# Patient Record
Sex: Male | Born: 2000 | Race: White | Hispanic: No | Marital: Single | Smoking: Never smoker
Health system: Southern US, Community
[De-identification: ages and names within clinical notes are randomized; demographics above are authoritative.]

## PROBLEM LIST (undated history)

## (undated) DIAGNOSIS — R569 Unspecified convulsions: Secondary | ICD-10-CM

## (undated) DIAGNOSIS — F909 Attention-deficit hyperactivity disorder, unspecified type: Secondary | ICD-10-CM

## (undated) DIAGNOSIS — Z9109 Other allergy status, other than to drugs and biological substances: Secondary | ICD-10-CM

## (undated) DIAGNOSIS — F419 Anxiety disorder, unspecified: Secondary | ICD-10-CM

## (undated) HISTORY — PX: APPENDECTOMY: SHX54

## (undated) HISTORY — DX: Attention-deficit hyperactivity disorder, unspecified type: F90.9

## (undated) HISTORY — PX: TYMPANOSTOMY TUBE PLACEMENT: SHX32

## (undated) HISTORY — DX: Unspecified convulsions: R56.9

---
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
## 2022-01-10 ENCOUNTER — Ambulatory Visit: Payer: BC Managed Care – PPO | Admitting: Orthopaedic Surgery

## 2022-01-15 ENCOUNTER — Ambulatory Visit (INDEPENDENT_AMBULATORY_CARE_PROVIDER_SITE_OTHER): Payer: BC Managed Care – PPO | Admitting: Orthopaedic Surgery

## 2022-01-15 ENCOUNTER — Ambulatory Visit (INDEPENDENT_AMBULATORY_CARE_PROVIDER_SITE_OTHER): Payer: BC Managed Care – PPO

## 2022-01-15 ENCOUNTER — Encounter: Payer: Self-pay | Admitting: Orthopaedic Surgery

## 2022-01-15 VITALS — BP 118/78 | HR 88 | Ht 70.0 in | Wt 173.0 lb

## 2022-01-15 DIAGNOSIS — M79644 Pain in right finger(s): Secondary | ICD-10-CM

## 2022-01-15 DIAGNOSIS — S62664A Nondisplaced fracture of distal phalanx of right ring finger, initial encounter for closed fracture: Secondary | ICD-10-CM

## 2022-01-15 NOTE — Progress Notes (Signed)
? ?Subjective:  ? ? Patient ID: Todd Lyons, male    DOB: 2000/11/07, 21 y.o.   MRN: [id number] ? ?HPI ?I hurt my finger playing softball about 10 days ago.   ? ?He was seen at Urgent Care in [place].  X-rays showed fracture of the right ring finger distally.  He did not bring X-rays with him.  He has no splint.  He has subungual hematoma and some swelling of the right ring finger.  He has no other injury.  He has little pain. ? ? ?Review of Systems  ?Constitutional:  Positive for activity change.  ?Musculoskeletal:  Positive for arthralgias.  ?Allergic/Immunologic: Positive for environmental allergies.  ?All other systems reviewed and are negative. ?For Review of Systems, all other systems reviewed and are negative. ? ?The following is a summary of the past history medically, past history surgically, known current medicines, social history and family history.  This information is gathered electronically by the computer from prior information and documentation.  I review this each visit and have found including this information at this point in the chart is beneficial and informative.  ? ?Past Medical History:  ?Diagnosis Date  ? ADHD (attention deficit hyperactivity disorder)   ? Environmental allergies   ? Seizures (HCC)   ? ? ?Past Surgical History:  ?Procedure Laterality Date  ? LAPAROSCOPIC APPENDECTOMY N/A 08/29/2021  ? Procedure: APPENDECTOMY LAPAROSCOPIC;  Surgeon: Lucretia RoersBridges, Lindsay C, MD;  Location: [facility];  Service: General;  Laterality: N/A;  ? TYMPANOSTOMY TUBE PLACEMENT    ? ? ?Current Outpatient Medications on File Prior to Visit  ?Medication Sig Dispense Refill  ? albuterol (PROVENTIL HFA;VENTOLIN HFA) 108 (90 Base) MCG/ACT inhaler Inhale 1-2 puffs into the lungs every 6 (six) hours as needed for wheezing or shortness of breath.     ? atomoxetine (STRATTERA) 80 MG capsule Take 100 mg by mouth at bedtime.    ? cetirizine (ZYRTEC) 10 MG tablet Take 10 mg by mouth daily.    ? LamoTRIgine 200 MG TB24 24  hour tablet Take 2 tablets by mouth at bedtime.    ? montelukast (SINGULAIR) 10 MG tablet Take 10 mg by mouth daily.    ? traZODone (DESYREL) 50 MG tablet Take 50 mg by mouth at bedtime.    ? benzonatate (TESSALON) 100 MG capsule Take 1 capsule (100 mg total) by mouth every 8 (eight) hours. (Patient not taking: Reported on 08/29/2021) 21 capsule 0  ? cetirizine-pseudoephedrine (ZYRTEC-D) 5-120 MG tablet Take 1 tablet by mouth daily. (Patient not taking: Reported on 08/29/2021) 30 tablet 0  ? cloNIDine (CATAPRES) 0.1 MG tablet TAKE 1 AND 1/2 TABLETS 30-45 MINS PRIOR TO BEDTIME (Patient not taking: Reported on 08/29/2021) 50 tablet 5  ? dicyclomine (BENTYL) 20 MG tablet Take 1 tablet (20 mg total) by mouth 2 (two) times daily. (Patient not taking: Reported on 08/29/2021) 20 tablet 0  ? escitalopram (LEXAPRO) 10 MG tablet Take 15 mg by mouth daily.    ? fluticasone (FLONASE) 50 MCG/ACT nasal spray Place 2 sprays into both nostrils daily. (Patient not taking: Reported on 08/29/2021) 16 g 0  ? lidocaine (XYLOCAINE) 2 % solution Use as directed 10 mLs in the mouth or throat every 3 (three) hours as needed for mouth pain. 100 mL 0  ? loperamide (IMODIUM) 2 MG capsule Take 1 capsule (2 mg total) by mouth 4 (four) times daily as needed for diarrhea or loose stools. (Patient not taking: Reported on 08/29/2021) 12 capsule 0  ?  montelukast (SINGULAIR) 5 MG chewable tablet Chew 1 tablet (5 mg total) by mouth daily. (Patient not taking: Reported on 08/29/2021) 30 tablet 5  ? NAYZILAM 5 MG/0.1ML SOLN     ? ondansetron (ZOFRAN) 4 MG tablet Take 1 tablet (4 mg total) by mouth every 8 (eight) hours as needed. 30 tablet 1  ? ondansetron (ZOFRAN-ODT) 4 MG disintegrating tablet Take 1 tablet (4 mg total) by mouth every 8 (eight) hours as needed for nausea or vomiting. (Patient not taking: Reported on 01/15/2022) 20 tablet 0  ? oxyCODONE (ROXICODONE) 5 MG immediate release tablet Take 1 tablet (5 mg total) by mouth every 4 (four) hours as  needed. 20 tablet 0  ? Pediatric Multiple Vit-C-FA (KIDS VITAMINS PO) Take 2 tablets by mouth daily. (Patient not taking: Reported on 01/15/2022)    ? [DISCONTINUED] levETIRAcetam 500 MG TB3D Take 1/2 tablet twice daily for 7 days, 1 tablet twice daily for 7 days, then 1-1/2 tablets twice daily 100 tablet 5  ? ?No current facility-administered medications on file prior to visit.  ? ? ?Social History  ? ?Socioeconomic History  ? Marital status: Single  ?  Spouse name: Not on file  ? Number of children: Not on file  ? Years of education: Not on file  ? Highest education level: Not on file  ?Occupational History  ? Not on file  ?Tobacco Use  ? Smoking status: Never  ? Smokeless tobacco: Never  ?Vaping Use  ? Vaping Use: Never used  ?Substance and Sexual Activity  ? Alcohol use: No  ? Drug use: No  ? Sexual activity: Yes  ?  Birth control/protection: Condom  ?Other Topics Concern  ? Not on file  ?Social History Narrative  ? 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  ? He attends [employer].  ? He lives with both parents. He has two brothers.  ? He enjoys firefighting, fishing, and video games.  ? ?Social Determinants of Health  ? ?Financial Resource Strain: Not on file  ?Food Insecurity: Not on file  ?Transportation Needs: Not on file  ?Physical Activity: Not on file  ?Stress: Not on file  ?Social Connections: Not on file  ?Intimate Partner Violence: Not on file  ? ? ?Family History  ?Problem Relation Age of Onset  ? Asthma Father   ? Asthma Paternal Grandmother   ? Allergic rhinitis Neg Hx   ? Angioedema Neg Hx   ? Atopy Neg Hx   ? Eczema Neg Hx   ? Immunodeficiency Neg Hx   ? Urticaria Neg Hx   ? ? ?BP 118/78   Pulse 88   Ht 5\' 10"  (1.778 m)   Wt 173 lb (78.5 kg)   BMI 24.82 kg/m?  ? ?Body mass index is 24.82 kg/m?. ? ?   ?Objective:  ? Physical Exam ?Vitals and nursing note reviewed. Exam conducted with a chaperone present.  ?Constitutional:   ?   Appearance: He is well-developed.  ?HENT:  ?   Head:  Normocephalic and atraumatic.  ?Eyes:  ?   Conjunctiva/sclera: Conjunctivae normal.  ?   Pupils: Pupils are equal, round, and reactive to light.  ?Cardiovascular:  ?   Rate and Rhythm: Normal rate and regular rhythm.  ?Pulmonary:  ?   Effort: Pulmonary effort is normal.  ?Abdominal:  ?   Palpations: Abdomen is soft.  ?Musculoskeletal:  ?     Hands: ? ?   Cervical back: Normal range of motion and neck supple.  ?Skin: ?  General: Skin is warm and dry.  ?Neurological:  ?   Mental Status: He is alert and oriented to person, place, and time.  ?   Cranial Nerves: No cranial nerve deficit.  ?   Motor: No abnormal muscle tone.  ?   Coordination: Coordination normal.  ?   Deep Tendon Reflexes: Reflexes are normal and symmetric. Reflexes normal.  ?Psychiatric:     ?   Behavior: Behavior normal.     ?   Thought Content: Thought content normal.     ?   Judgment: Judgment normal.  ?X-rays were done of the right ring finger, reported separately. ? ? ? ? ?   ?Assessment & Plan:  ? ?Encounter Diagnoses  ?Name Primary?  ? Pain in right finger(s) Yes  ? Closed nondisplaced fracture of distal phalanx of right ring finger, initial encounter   ? ?I have explained the findings to him. ? ?He has a splint from the Urgent Care.  He does not like wearing it.  I told him as long as he did not hit the finger or squeezed something hard, he should be OK. ? ?Return in one month. ? ?X-rays then. ? ?Call if any problem. ? ?Precautions discussed. ? ?Electronically Signed ?Darreld Mclean, MD ?5/2/20239:08 AM ? ?

## 2022-01-27 ENCOUNTER — Ambulatory Visit
Admission: EM | Admit: 2022-01-27 | Discharge: 2022-01-27 | Disposition: A | Discharge status: Home / Self Care | Payer: BC Managed Care – PPO | Attending: Internal Medicine | Admitting: Internal Medicine

## 2022-01-27 ENCOUNTER — Encounter: Payer: Self-pay | Admitting: Emergency Medicine

## 2022-01-27 DIAGNOSIS — K122 Cellulitis and abscess of mouth: Secondary | ICD-10-CM | POA: Insufficient documentation

## 2022-01-27 LAB — POCT RAPID STREP A (OFFICE): Rapid Strep A Screen: NEGATIVE

## 2022-01-27 MED ORDER — NYSTATIN 100000 UNIT/ML MT SUSP: 15.0000 mL | Freq: Three times a day (TID) | OROMUCOSAL | 0 refills | Status: DC

## 2022-01-27 NOTE — Discharge Instructions (Addendum)
Please take medications as prescribed ?Tylenol/Motrin as needed for pain and/or fever ?Strep test is negative ?We will call you with recommendations if labs are abnormal ?Please go to ED if you have persistent difficulty swallowing, noisy breathing or tightness in your throat. ?

## 2022-01-27 NOTE — ED Provider Notes (Signed)
?[facility] ? ? ? ?CSN: [id number] ?Arrival date & time: 01/27/22  [id number] ? ? ?  ? ?History   ?Chief Complaint ?No chief complaint on file. ? ? ?HPI ?Todd Lyons is a 21 y.o. male comes to the urgent care with discomfort in the throat this morning.  Patient woke up with the symptoms.  He looked in his throat and realized that his uvula was swollen.  He denies any changes in his medications..  No allergies to food.  No changes in dietary habits.  No pain with swallowing.  No sick contacts.  No shortness of breath, noisy breathing or chest tightness..  ? ?HPI ? ?Past Medical History:  ?Diagnosis Date  ? ADHD (attention deficit hyperactivity disorder)   ? Environmental allergies   ? Seizures ([place])   ? ? ?Patient Active Problem List  ? Diagnosis Date Noted  ? Acute appendicitis with localized peritonitis, without perforation, abscess, or gangrene   ? Insomnia 10/01/2019  ? 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 Poor sleep hygiene 08/23/2019  ? Single epileptic seizure ([place]) 12/09/2018  ? ADHD (attention deficit hyperactivity disorder)   ? CLOSED FRACTURE OF SHAFT OF FIBULA WITH TIBIA 01/11/2010  ? ? ?Past Surgical History:  ?Procedure Laterality Date  ? LAPAROSCOPIC APPENDECTOMY N/A 08/29/2021  ? Procedure: APPENDECTOMY LAPAROSCOPIC;  Surgeon: Virl Cagey, MD;  Location: [facility];  Service: General;  Laterality: N/A;  ? TYMPANOSTOMY TUBE PLACEMENT    ? ? ? ? ? ?Home Medications   ? ?Prior to Admission medications   ?Medication Sig Start Date End Date Taking? Authorizing Provider  ?magic mouthwash (nystatin, hydrocortisone, diphenhydrAMINE) suspension Swish and swallow 15 mLs 3 (three) times daily. Compounding formula: Nystatin 100,000 units/mL - 30 mL, hydrocortisone-60 mL, Benadryl 12.5 mg / 5 mL-QS to 272ml 01/27/22  Yes Jissell Trafton, Myrene Galas, MD  ?sertraline (ZOLOFT) 25 MG tablet Take 25 mg by mouth daily.   Yes [provider]  ?albuterol (PROVENTIL HFA;VENTOLIN HFA) 108 (90  Base) MCG/ACT inhaler Inhale 1-2 puffs into the lungs every 6 (six) hours as needed for wheezing or shortness of breath.  04/29/16   [provider]  ?atomoxetine (STRATTERA) 80 MG capsule Take 100 mg by mouth at bedtime. 11/04/18   [provider]  ?cetirizine (ZYRTEC) 10 MG tablet Take 10 mg by mouth daily.    [provider]  ?LamoTRIgine 200 MG TB24 24 hour tablet Take 2 tablets by mouth at bedtime. 07/03/21   [provider]  ?lidocaine (XYLOCAINE) 2 % solution Use as directed 10 mLs in the mouth or throat every 3 (three) hours as needed for mouth pain. 09/25/21   Volney American, PA-C  ?loperamide (IMODIUM) 2 MG capsule Take 1 capsule (2 mg total) by mouth 4 (four) times daily as needed for diarrhea or loose stools. ?Patient not taking: Reported on 08/29/2021 12/20/20   Wurst, [place], PA-C  ?montelukast (SINGULAIR) 10 MG tablet Take 10 mg by mouth daily. 09/16/19   [provider]  ?Jacqlyn Larsen 5 MG/0.1ML SOLN  11/12/19   [provider]  ?ondansetron (ZOFRAN) 4 MG tablet Take 1 tablet (4 mg total) by mouth every 8 (eight) hours as needed. 08/29/21 08/29/22  Virl Cagey, MD  ?oxyCODONE (ROXICODONE) 5 MG immediate release tablet Take 1 tablet (5 mg total) by mouth every 4 (four) hours as needed. 08/29/21 08/29/22  Virl Cagey, MD  ?traZODone (DESYREL) 50 MG tablet Take 50 mg by mouth at bedtime. 07/15/21  [provider]  ?levETIRAcetam 500 MG TB3D Take 1/2 tablet twice daily for 7 days, 1 tablet twice daily for 7 days, then 1-1/2 tablets twice daily 10/01/19 08/03/20  Jodi Geralds, MD  ? ? ?Family History ?Family History  ?Problem Relation Age of Onset  ? Asthma Father   ? Asthma Paternal Grandmother   ? Allergic rhinitis Neg Hx   ? Angioedema Neg Hx   ? Atopy Neg Hx   ? Eczema Neg Hx   ? Immunodeficiency Neg Hx   ? Urticaria Neg Hx   ? ? ?Social History ?Social History  ? ?Tobacco Use  ? Smoking status: Never  ? Smokeless  tobacco: Never  ?Vaping Use  ? Vaping Use: Never used  ?Substance Use Topics  ? Alcohol use: No  ? Drug use: No  ? ? ? ?Allergies   ?Patient has no known allergies. ? ? ?Review of Systems ?Review of Systems ?As per HPI ? ?Physical Exam ?Triage Vital Signs ?ED Triage Vitals  ?Enc Vitals Group  ?   BP 01/27/22 0929 124/77  ?   Pulse Rate 01/27/22 0929 (!) 106  ?   Resp 01/27/22 0929 18  ?   Temp 01/27/22 0929 98 ?F (36.7 ?C)  ?   Temp Source 01/27/22 0929 Oral  ?   SpO2 01/27/22 0929 99 %  ?   Weight --   ?   Height --   ?   Head Circumference --   ?   Peak Flow --   ?   Pain Score 01/27/22 0930 2  ?   Pain Loc --   ?   Pain Edu? --   ?   Excl. in [place]? --   ? ?No data found. ? ?Updated Vital Signs ?BP 124/77 (BP Location: Right Arm)   Pulse (!) 106   Temp 98 ?F (36.7 ?C) (Oral)   Resp 18   SpO2 99%  ? ?Visual Acuity ?Right Eye Distance:   ?Left Eye Distance:   ?Bilateral Distance:   ? ?Right Eye Near:   ?Left Eye Near:    ?Bilateral Near:    ? ?Physical Exam ?Vitals and nursing note reviewed.  ?HENT:  ?   Mouth/Throat:  ?   Mouth: Mucous membranes are moist.  ?   Pharynx: No oropharyngeal exudate or posterior oropharyngeal erythema.  ?   Comments: Uvula appears swollen.  Airway remains patent. ?Cardiovascular:  ?   Rate and Rhythm: Normal rate and regular rhythm.  ?   Pulses: Normal pulses.  ?   Heart sounds: Normal heart sounds.  ?Pulmonary:  ?   Effort: Pulmonary effort is normal.  ?   Breath sounds: Normal breath sounds.  ? ? ? ?UC Treatments / Results  ?Labs ?(all labs ordered are listed, but only abnormal results are displayed) ?Labs Reviewed  ?CULTURE, GROUP A STREP [facility])  ?POCT RAPID STREP A (OFFICE)  ? ? ?EKG ? ? ?Radiology ?No results found. ? ?Procedures ?Procedures (including critical care time) ? ?Medications Ordered in UC ?Medications - No data to display ? ?Initial Impression / Assessment and Plan / UC Course  ?I have reviewed the triage vital signs and the nursing notes. ? ?Pertinent labs & imaging  results that were available during my care of the patient were reviewed by me and considered in my medical decision making (see chart for details). ? ?  ? ?1.  Acute uvulitis: ?Mouthwash with hydrocortisone will help with swelling and inflammation ?Tylenol/Motrin as needed  for pain and/or fever ?Strep test is negative ?Return to ED precautions given ?Final Clinical Impressions(s) / UC Diagnoses  ? ?Final diagnoses:  ?Uvulitis  ? ? ? ?Discharge Instructions   ? ?  ?Please take medications as prescribed ?Tylenol/Motrin as needed for pain and/or fever ?Strep test is negative ?We will call you with recommendations if labs are abnormal ?Please go to ED if you have persistent difficulty swallowing, noisy breathing or tightness in your throat. ? ? ?ED Prescriptions   ? ? Medication Sig Dispense Auth. Provider  ? magic mouthwash (nystatin, hydrocortisone, diphenhydrAMINE) suspension Swish and swallow 15 mLs 3 (three) times daily. Compounding formula: Nystatin 100,000 units/mL - 30 mL, hydrocortisone-60 mL, Benadryl 12.5 mg / 5 mL-QS to 237ml 240 mL Kiyara Bouffard, Myrene Galas, MD  ? ?  ? ?PDMP not reviewed this encounter. ?  ?Chase Picket, MD ?01/27/22 1115 ? ?

## 2022-01-27 NOTE — ED Triage Notes (Signed)
Sore throat that started today. 

## 2022-01-30 LAB — CULTURE, GROUP A STREP (THRC)

## 2022-02-26 ENCOUNTER — Encounter: Payer: BC Managed Care – PPO | Admitting: Orthopaedic Surgery

## 2022-06-04 ENCOUNTER — Encounter: Payer: Self-pay | Admitting: Emergency Medicine

## 2022-06-04 ENCOUNTER — Ambulatory Visit
Admission: EM | Admit: 2022-06-04 | Discharge: 2022-06-04 | Disposition: A | Discharge status: Home / Self Care | Payer: BC Managed Care – PPO | Attending: Emergency Medicine | Admitting: Emergency Medicine

## 2022-06-04 DIAGNOSIS — K649 Unspecified hemorrhoids: Secondary | ICD-10-CM

## 2022-06-04 MED ORDER — GOODSENSE PSYLLIUM FIBER 51.7 % PO POWD: 1.0000 | Freq: Two times a day (BID) | ORAL | 1 refills | Status: DC

## 2022-06-04 MED ORDER — PHENYLEPHRINE-MINERAL OIL-PET 0.25-14-74.9 % RE OINT: 1.0000 | TOPICAL_OINTMENT | Freq: Two times a day (BID) | RECTAL | 2 refills | Status: DC | PRN

## 2022-06-04 NOTE — Discharge Instructions (Addendum)
At this time, based on the information that you provided to me, the believe that the most likely source of your bleeding is a simple hemorrhoid.  I have sent a prescription for medication to your pharmacy that I want you to use twice daily to constrict blood vessels and hopefully stop the bleeding.  Please continue taking a fiber capsule twice daily with 16 ounces of water each time and begin drinking another 32 to 48 ounces of water throughout the day.  I have enclosed some information about hemorrhoids and diverticulitis that I hope you find helpful.  If you continue to have bleeding when you move your bowels after 7 to 14 days of this regimen, please follow-up with your primary care provider to discuss referral to gastroenterology for further evaluation.    Thank you for visiting urgent care today.

## 2022-06-04 NOTE — ED Provider Notes (Signed)
[facility]    CSN: [id number] Arrival date & time: 06/04/22  1516    HISTORY   Chief Complaint  Patient presents with   Rectal Bleeding   HPI Todd Lyons is a pleasant, 21 y.o. male who presents to urgent care today. Patient complains of painless bleeding from his anus after moving his bowels, states he notices it both on his toilet tissue after wiping as well as small drops in the toilet.  Patient states this has been going on for a week.  Patient states he has had this in the past but is never lasted this long.  Patient states his grandmother has a history of diverticulitis, states he decided to purchase some over-the-counter psyllium fiber capsules which he has been taking for the past 2 days without relief.  Patient states he does not drink a lot of water, denies known diagnosis of constipation, abdominal pain, fever, body aches, chills, dizziness, feeling bloated, diarrhea, nausea, vomiting.  Vital signs are normal at this time.  Patient is otherwise well-appearing.  The history is provided by the patient.   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 Authorizing Provider  albuterol (PROVENTIL HFA;VENTOLIN HFA) 108 (90 Base) MCG/ACT inhaler Inhale 1-2 puffs into the lungs every 6 (six) hours as needed for wheezing or shortness of breath.  04/29/16   [provider]  atomoxetine (STRATTERA) 80 MG capsule Take 100 mg by mouth at bedtime. 11/04/18   [provider]  cetirizine (ZYRTEC) 10 MG tablet Take 10 mg by mouth daily.    [provider]  LamoTRIgine 200 MG TB24 24 hour tablet Take 2 tablets by mouth at bedtime. 07/03/21   [provider]  montelukast (SINGULAIR) 10 MG tablet Take 10 mg by mouth daily. 09/16/19   [provider]  NAYZILAM 5 MG/0.1ML SOLN  11/12/19   [provider]  sertraline (ZOLOFT) 25 MG tablet Take 25 mg by mouth daily.    [provider]  traZODone (DESYREL) 50 MG tablet Take 50 mg by mouth at bedtime. 07/15/21   [provider]  levETIRAcetam 500 MG TB3D Take 1/2 tablet twice daily for 7 days, 1 tablet twice daily for 7 days, then 1-1/2 tablets twice daily 10/01/19 08/03/20  Deetta Perla, MD    Family History Family History  Problem Relation Age of Onset   Asthma Father    Asthma Paternal Grandmother    Allergic rhinitis Neg Hx    Angioedema Neg Hx    Atopy Neg Hx    Eczema Neg Hx    Immunodeficiency Neg Hx  Urticaria Neg Hx    Social History Social History   Tobacco Use   Smoking status: Never   Smokeless tobacco: Never  Vaping Use   Vaping Use: Never used  Substance Use Topics   Alcohol use: No   Drug use: No   Allergies   Patient has no known allergies.  Review of Systems Review of Systems Pertinent findings revealed after performing a 14 point review of systems has been noted in the history of present illness.  Physical Exam Triage Vital Signs ED Triage Vitals  Enc Vitals Group     BP 07/13/21 0827 (!) 147/82     Pulse Rate 07/13/21 0827 72     Resp  07/13/21 0827 18     Temp 07/13/21 0827 98.3 F (36.8 C)     Temp Source 07/13/21 0827 Oral     SpO2 07/13/21 0827 98 %     Weight --      Height --      Head Circumference --      Peak Flow --      Pain Score 07/13/21 0826 5     Pain Loc --      Pain Edu? --      Excl. in GC? --   No data found.  Updated Vital Signs BP 107/72 (BP Location: Right Arm)   Pulse 89   Temp 98.2 F (36.8 C) (Oral)   Resp 14   SpO2 96%   Physical Exam Vitals and nursing note reviewed.  Constitutional:      General: He is not in acute distress.    Appearance: Normal appearance. He is normal weight. He is not ill-appearing.  HENT:     Head: Normocephalic and atraumatic.  Eyes:     Extraocular Movements: Extraocular movements intact.     Conjunctiva/sclera: Conjunctivae normal.     Pupils: Pupils are equal, round, and reactive to light.  Cardiovascular:     Rate and Rhythm: Normal rate and regular rhythm.  Pulmonary:     Effort: Pulmonary effort is normal.     Breath sounds: Normal breath sounds.  Genitourinary:    Comments: Patient politely declines rectal exam during his visit today. Musculoskeletal:        General: Normal range of motion.     Cervical back: Normal range of motion and neck supple.  Skin:    General: Skin is warm and dry.  Neurological:     General: No focal deficit present.     Mental Status: He is alert and oriented to person, place, and time. Mental status is at baseline.  Psychiatric:        Mood and Affect: Mood normal.        Behavior: Behavior normal.        Thought Content: Thought content normal.        Judgment: Judgment normal.     Visual Acuity Right Eye Distance:   Left Eye Distance:   Bilateral Distance:    Right Eye Near:   Left Eye Near:    Bilateral Near:     UC Couse / Diagnostics / Procedures:     Radiology No results found.  Procedures Procedures (including critical care time) EKG  Pending results:  Labs Reviewed - No data to  display   Medications Ordered in UC: Medications - No data to display  UC Diagnoses / Final Clinical Impressions(s)   I have reviewed the triage vital signs and the nursing notes.  Pertinent labs &  imaging results that were available during my care of the patient were reviewed by me and considered in my medical decision making (see chart for details).    Final diagnoses:  Hemorrhoids, unspecified hemorrhoid type   Conservative care provided for presumed hemorrhoids.  Patient advised that if bleeding or abdominal pain persists imaging will be indicated.  Patient advised he can go to the emergency room for acute pain or reach out to his primary care physician to have this ordered if needed.  Return precautions advised.  ED Prescriptions     Medication Sig Dispense Auth. Provider   phenylephrine-shark liver oil-mineral oil-petrolatum (PREPARATION H) 0.25-14-74.9 % rectal ointment Place 1 Application rectally 2 (two) times daily as needed for hemorrhoids. 56 g Theadora Rama Scales, PA-C   Psyllium (GOODSENSE PSYLLIUM FIBER) 51.7 % POWD Take 1 capsule by mouth every 12 (twelve) hours. 660 g Theadora Rama Scales, PA-C      PDMP not reviewed this encounter.  Disposition Upon Discharge:  Condition: stable for discharge home  Patient presented with concern for an acute illness with associated systemic symptoms and significant discomfort requiring urgent management. In my opinion, this is a condition that a prudent lay person (someone who possesses an average knowledge of health and medicine) may potentially expect to result in complications if not addressed urgently such as respiratory distress, impairment of bodily function or dysfunction of bodily organs.   As such, the patient has been evaluated and assessed, work-up was performed and treatment was provided in alignment with urgent care protocols and evidence based medicine.  Patient/parent/caregiver has been advised that the patient may  require follow up for further testing and/or treatment if the symptoms continue in spite of treatment, as clinically indicated and appropriate.  Routine symptom specific, illness specific and/or disease specific instructions were discussed with the patient and/or caregiver at length.  Prevention strategies for avoiding STD exposure were also discussed.  The patient will follow up with their current PCP if and as advised. If the patient does not currently have a PCP we will assist them in obtaining one.   The patient may need specialty follow up if the symptoms continue, in spite of conservative treatment and management, for further workup, evaluation, consultation and treatment as clinically indicated and appropriate.  Patient/parent/caregiver verbalized understanding and agreement of plan as discussed.  All questions were addressed during visit.  Please see discharge instructions below for further details of plan.  Discharge Instructions:   Discharge Instructions      At this time, based on the information that you provided to me, the believe that the most likely source of your bleeding is a simple hemorrhoid.  I have sent a prescription for medication to your pharmacy that I want you to use twice daily to constrict blood vessels and hopefully stop the bleeding.  Please continue taking a fiber capsule twice daily with 16 ounces of water each time and begin drinking another 32 to 48 ounces of water throughout the day.  I have enclosed some information about hemorrhoids and diverticulitis that I hope you find helpful.  If you continue to have bleeding when you move your bowels after 7 to 14 days of this regimen, please follow-up with your primary care provider to discuss referral to gastroenterology for further evaluation.    Thank you for visiting urgent care today.      This office note has been dictated using Teaching laboratory technician.  Unfortunately, this method of  dictation can sometimes lead  to typographical or grammatical errors.  I apologize for your inconvenience in advance if this occurs.  Please do not hesitate to reach out to me if clarification is needed.       Lynden Oxford Scales, PA-C 06/05/22 1131

## 2022-06-04 NOTE — ED Triage Notes (Addendum)
Pt reports for a week had blood in stools and wiping. Reports sometimes stools are formed and sometimes they are loose, but every time uses bathroom has blood. Reports that will have intermittent abd cramping. Denies urinary problems

## 2022-06-06 ENCOUNTER — Emergency Department (HOSPITAL_COMMUNITY)
Admission: EM | Admit: 2022-06-06 | Discharge: 2022-06-06 | Discharge status: Against Medical Advice | Payer: BC Managed Care – PPO | Attending: Emergency Medicine | Admitting: Emergency Medicine

## 2022-06-06 ENCOUNTER — Encounter (HOSPITAL_COMMUNITY): Payer: Self-pay | Admitting: Emergency Medicine

## 2022-06-06 ENCOUNTER — Other Ambulatory Visit: Payer: Self-pay

## 2022-06-06 DIAGNOSIS — K6289 Other specified diseases of anus and rectum: Secondary | ICD-10-CM | POA: Diagnosis present

## 2022-06-06 DIAGNOSIS — Z5321 Procedure and treatment not carried out due to patient leaving prior to being seen by health care provider: Secondary | ICD-10-CM | POA: Diagnosis not present

## 2022-06-06 LAB — COMPREHENSIVE METABOLIC PANEL
ALT: 36 U/L (ref 0–44)
AST: 22 U/L (ref 15–41)
Albumin: 4.5 g/dL (ref 3.5–5.0)
Alkaline Phosphatase: 100 U/L (ref 38–126)
Anion gap: 9 (ref 5–15)
BUN: 10 mg/dL (ref 6–20)
CO2: 25 mmol/L (ref 22–32)
Calcium: 9.6 mg/dL (ref 8.9–10.3)
Chloride: 105 mmol/L (ref 98–111)
Creatinine, Ser: 0.96 mg/dL (ref 0.61–1.24)
GFR, Estimated: >60 mL/min (ref >60)
Glucose, Bld: 88 mg/dL (ref 70–99)
Potassium: 3.9 mmol/L (ref 3.5–5.1)
Sodium: 139 mmol/L (ref 135–145)
Total Bilirubin: 0.4 mg/dL (ref 0.3–1.2)
Total Protein: 7.1 g/dL (ref 6.5–8.1)

## 2022-06-06 LAB — CBC WITH DIFFERENTIAL/PLATELET
Abs Immature Granulocytes: 0.03 10*3/uL (ref 0.00–0.07)
Basophils Absolute: 0 10*3/uL (ref 0.0–0.1)
Basophils Relative: 1 %
Eosinophils Absolute: 0.1 10*3/uL (ref 0.0–0.5)
Eosinophils Relative: 1 %
HCT: 45.2 % (ref 39.0–52.0)
Hemoglobin: 15.7 g/dL (ref 13.0–17.0)
Immature Granulocytes: 0 %
Lymphocytes Relative: 30 %
Lymphs Abs: 2.3 10*3/uL (ref 0.7–4.0)
MCH: 30.3 pg (ref 26.0–34.0)
MCHC: 34.7 g/dL (ref 30.0–36.0)
MCV: 87.1 fL (ref 80.0–100.0)
Monocytes Absolute: 1 10*3/uL (ref 0.1–1.0)
Monocytes Relative: 13 %
Neutro Abs: 4.2 10*3/uL (ref 1.7–7.7)
Neutrophils Relative %: 55 %
Platelets: 435 10*3/uL — ABNORMAL HIGH (ref 150–400)
RBC: 5.19 MIL/uL (ref 4.22–5.81)
RDW: 12.3 % (ref 11.5–15.5)
WBC: 7.6 10*3/uL (ref 4.0–10.5)
nRBC: 0 % (ref 0.0–0.2)

## 2022-06-06 LAB — TYPE AND SCREEN
ABO/RH(D): O POS
Antibody Screen: NEGATIVE

## 2022-06-06 LAB — ABO/RH: ABO/RH(D): O POS

## 2022-06-06 NOTE — ED Provider Triage Note (Signed)
Emergency Medicine Provider Triage Evaluation Note  Todd Lyons , a 21 y.o. male  was evaluated in triage.  Pt complains of rectal bleeding onset 1 week. Has rectal pain as well. No meds tried PTA. Denies history of hemorrhoids. Denies abdominal pain, nausea, vomiting, chest pain, shortness of breath, palpitations.  Review of Systems  Positive:  Negative:   Physical Exam  BP 119/72   Pulse 98   Resp 15   SpO2 97%  Gen:   Awake, no distress   Resp:  Normal effort  MSK:   Moves extremities without difficulty  Other:  Work-up initiated  Medical Decision Making  Medically screening exam initiated at 8:23 PM.  Appropriate orders placed.  Oma A Shelton was informed that the remainder of the evaluation will be completed by another provider, this initial triage assessment does not replace that evaluation, and the importance of remaining in the ED until their evaluation is complete.  Work-up initiated    Tyray Proch A, PA-C 06/06/22 2023

## 2022-06-06 NOTE — ED Triage Notes (Signed)
Pt reported to ED reporting copious amount of blood in stool that has been ongoing x1 week. Endorses pain to rectum. Denies hx of hemorrhoids.

## 2022-06-06 NOTE — ED Notes (Signed)
PT called X2 for vitals no response

## 2022-06-06 NOTE — ED Notes (Addendum)
Called for patient x 3, no response 

## 2022-08-12 ENCOUNTER — Emergency Department (HOSPITAL_BASED_OUTPATIENT_CLINIC_OR_DEPARTMENT_OTHER)
Admission: EM | Admit: 2022-08-12 | Discharge: 2022-08-12 | Disposition: A | Discharge status: Home / Self Care | Payer: BC Managed Care – PPO | Attending: Emergency Medicine | Admitting: Emergency Medicine

## 2022-08-12 ENCOUNTER — Other Ambulatory Visit: Payer: Self-pay

## 2022-08-12 ENCOUNTER — Encounter (HOSPITAL_BASED_OUTPATIENT_CLINIC_OR_DEPARTMENT_OTHER): Payer: Self-pay | Admitting: Emergency Medicine

## 2022-08-12 DIAGNOSIS — M791 Myalgia, unspecified site: Secondary | ICD-10-CM | POA: Insufficient documentation

## 2022-08-12 DIAGNOSIS — J029 Acute pharyngitis, unspecified: Secondary | ICD-10-CM | POA: Diagnosis present

## 2022-08-12 DIAGNOSIS — Z20822 Contact with and (suspected) exposure to covid-19: Secondary | ICD-10-CM | POA: Diagnosis not present

## 2022-08-12 LAB — GROUP A STREP BY PCR: Group A Strep by PCR: NOT DETECTED

## 2022-08-12 LAB — RESP PANEL BY RT-PCR (FLU A&B, COVID) ARPGX2
Influenza A by PCR: NEGATIVE
Influenza B by PCR: NEGATIVE
SARS Coronavirus 2 by RT PCR: NEGATIVE

## 2022-08-12 MED ORDER — ACETAMINOPHEN 325 MG PO TABS
650.0000 mg | ORAL_TABLET | Freq: Once | ORAL | Status: AC
  Administered 2022-08-12: 650 mg via ORAL
  Filled 2022-08-12: qty 2

## 2022-08-12 MED ORDER — PENICILLIN G BENZATHINE 1200000 UNIT/2ML IM SUSY
1.2000 10*6.[IU] | PREFILLED_SYRINGE | Freq: Once | INTRAMUSCULAR | Status: AC
  Administered 2022-08-12: 1.2 10*6.[IU] via INTRAMUSCULAR
  Filled 2022-08-12: qty 2

## 2022-08-12 NOTE — Discharge Instructions (Addendum)
You presented today with a sore throat.  Your swab is negative for strep throat however we gave you a penicillin shot as treatment due to your exposure.  Continue to use Tylenol or Motrin for any pain or fevers.  Return with any worsening symptoms, otherwise you may see your PCP as needed.  It was a pleasure to meet you and we hope you feel better!

## 2022-08-12 NOTE — ED Notes (Signed)
Pt notes he'll be waiting in his car

## 2022-08-12 NOTE — ED Triage Notes (Signed)
Pt arrives to ED with c/o sore throat, fever, headache x2 days.

## 2022-08-12 NOTE — ED Provider Notes (Signed)
[facility] EMERGENCY DEPT Provider Note   CSN: [id number] Arrival date & time: 08/12/22  1547     History  Chief Complaint  Patient presents with   Sore Throat    Todd Lyons is a 21 y.o. male presenting with a concern for sore throat.  Reports his girlfriend tested positive for strep throat 2 days ago.  Says that his pain is better today but he has had some bodyaches and felt febrile.   Sore Throat       Home Medications Prior to Admission medications   Medication Sig Start Date End Date Taking? Authorizing Provider  albuterol (PROVENTIL HFA;VENTOLIN HFA) 108 (90 Base) MCG/ACT inhaler Inhale 1-2 puffs into the lungs every 6 (six) hours as needed for wheezing or shortness of breath.  04/29/16   [provider]  atomoxetine (STRATTERA) 80 MG capsule Take 100 mg by mouth at bedtime. 11/04/18   [provider]  cetirizine (ZYRTEC) 10 MG tablet Take 10 mg by mouth daily.    [provider]  LamoTRIgine 200 MG TB24 24 hour tablet Take 2 tablets by mouth at bedtime. 07/03/21   [provider]  montelukast (SINGULAIR) 10 MG tablet Take 10 mg by mouth daily. 09/16/19   [provider]  NAYZILAM 5 MG/0.1ML SOLN  11/12/19   [provider]  phenylephrine-shark liver oil-mineral oil-petrolatum (PREPARATION H) 0.25-14-74.9 % rectal ointment Place 1 Application rectally 2 (two) times daily as needed for hemorrhoids. 06/04/22   Theadora Rama Scales, PA-C  Psyllium (GOODSENSE PSYLLIUM FIBER) 51.7 % POWD Take 1 capsule by mouth every 12 (twelve) hours. 06/04/22   Theadora Rama Scales, PA-C  sertraline (ZOLOFT) 25 MG tablet Take 25 mg by mouth daily.    [provider]  traZODone (DESYREL) 50 MG tablet Take 50 mg by mouth at bedtime. 07/15/21   [provider]  levETIRAcetam 500 MG TB3D Take 1/2 tablet twice daily for 7 days, 1 tablet twice daily for 7 days, then 1-1/2 tablets twice daily 10/01/19 08/03/20   Deetta Perla, MD      Allergies    Patient has no known allergies.    Review of Systems   Review of Systems  Physical Exam Updated Vital Signs BP 126/71   Pulse (!) 121   Temp 98.7 F (37.1 C)   Resp 20   Ht 5\' 11"  (1.803 m)   Wt 79.4 kg   SpO2 99%   BMI 24.41 kg/m  Physical Exam Vitals and nursing note reviewed.  Constitutional:      General: He is not in acute distress.    Appearance: Normal appearance. He is not ill-appearing.  HENT:     Head: Normocephalic and atraumatic.     Mouth/Throat:     Mouth: Mucous membranes are moist.     Pharynx: Oropharynx is clear.     Tonsils: No tonsillar exudate or tonsillar abscesses.  Eyes:     General: No scleral icterus.    Conjunctiva/sclera: Conjunctivae normal.  Cardiovascular:     Rate and Rhythm: Normal rate and regular rhythm.  Pulmonary:     Effort: Pulmonary effort is normal. No respiratory distress.     Breath sounds: No wheezing or rhonchi.  Skin:    Findings: No rash.  Neurological:     Mental Status: He is alert.  Psychiatric:        Mood and Affect: Mood normal.     ED Results / Procedures / Treatments   Labs (all  labs ordered are listed, but only abnormal results are displayed) Labs Reviewed  GROUP A STREP BY PCR  RESP PANEL BY RT-PCR (FLU A&B, COVID) [id number]    EKG None  Radiology No results found.  Procedures Procedures   Medications Ordered in ED Medications - No data to display  ED Course/ Medical Decision Making/ A&P                           Medical Decision Making  21 year old male presenting today with concern for strep throat.  His girlfriend currently has strep throat and is being treated with amoxicillin.  Strep, flu and COVID test all negative today.  Due to his sore throat and clinical symptoms of strep I will go ahead and treat him for strep throat being that he is in close proximity to someone who was tested positive.  Given a penicillin injection, tylenol and  discharged home.  Return precautions discussed, otherwise patient can follow-up with PCP as necessary.  Final Clinical Impression(s) / ED Diagnoses Final diagnoses:  Pharyngitis, unspecified etiology    Rx / DC Orders ED Discharge Orders     None      Results and diagnoses were explained to the patient. Return precautions discussed in full. Patient had no additional questions and expressed complete understanding.   This chart was dictated using voice recognition software.  Despite best efforts to proofread,  errors can occur which can change the documentation meaning.    Saddie Benders, PA-C 08/12/22 1821    Mardene Sayer, MD 08/13/22 0002

## 2022-11-07 ENCOUNTER — Emergency Department (HOSPITAL_COMMUNITY)
Admission: EM | Admit: 2022-11-07 | Discharge: 2022-11-07 | Disposition: A | Discharge status: Home / Self Care | Payer: BC Managed Care – PPO | Attending: Emergency Medicine | Admitting: Emergency Medicine

## 2022-11-07 ENCOUNTER — Encounter (HOSPITAL_COMMUNITY): Payer: Self-pay

## 2022-11-07 ENCOUNTER — Emergency Department (HOSPITAL_COMMUNITY): Payer: BC Managed Care – PPO

## 2022-11-07 ENCOUNTER — Other Ambulatory Visit: Payer: Self-pay

## 2022-11-07 DIAGNOSIS — N132 Hydronephrosis with renal and ureteral calculous obstruction: Secondary | ICD-10-CM | POA: Diagnosis not present

## 2022-11-07 DIAGNOSIS — N200 Calculus of kidney: Secondary | ICD-10-CM

## 2022-11-07 DIAGNOSIS — R109 Unspecified abdominal pain: Secondary | ICD-10-CM | POA: Diagnosis present

## 2022-11-07 LAB — BASIC METABOLIC PANEL
Anion gap: 9 (ref 5–15)
BUN: 10 mg/dL (ref 6–20)
CO2: 21 mmol/L — ABNORMAL LOW (ref 22–32)
Calcium: 9.3 mg/dL (ref 8.9–10.3)
Chloride: 105 mmol/L (ref 98–111)
Creatinine, Ser: 1.05 mg/dL (ref 0.61–1.24)
GFR, Estimated: >60 mL/min (ref >60)
Glucose, Bld: 103 mg/dL — ABNORMAL HIGH (ref 70–99)
Potassium: 3.3 mmol/L — ABNORMAL LOW (ref 3.5–5.1)
Sodium: 135 mmol/L (ref 135–145)

## 2022-11-07 LAB — URINALYSIS, ROUTINE W REFLEX MICROSCOPIC
Bilirubin Urine: NEGATIVE
Glucose, UA: NEGATIVE mg/dL
Ketones, ur: NEGATIVE mg/dL
Leukocytes,Ua: NEGATIVE
Nitrite: NEGATIVE
Protein, ur: NEGATIVE mg/dL
RBC / HPF: >50 RBC/hpf (ref 0–5)
Specific Gravity, Urine: 1.012 (ref 1.005–1.030)
pH: 6 (ref 5.0–8.0)

## 2022-11-07 LAB — CBC WITH DIFFERENTIAL/PLATELET
Abs Immature Granulocytes: 0.04 10*3/uL (ref 0.00–0.07)
Basophils Absolute: 0.1 10*3/uL (ref 0.0–0.1)
Basophils Relative: 1 %
Eosinophils Absolute: 0.1 10*3/uL (ref 0.0–0.5)
Eosinophils Relative: 1 %
HCT: 44.9 % (ref 39.0–52.0)
Hemoglobin: 15.6 g/dL (ref 13.0–17.0)
Immature Granulocytes: 0 %
Lymphocytes Relative: 18 %
Lymphs Abs: 1.9 10*3/uL (ref 0.7–4.0)
MCH: 29.4 pg (ref 26.0–34.0)
MCHC: 34.7 g/dL (ref 30.0–36.0)
MCV: 84.7 fL (ref 80.0–100.0)
Monocytes Absolute: 0.8 10*3/uL (ref 0.1–1.0)
Monocytes Relative: 8 %
Neutro Abs: 7.5 10*3/uL (ref 1.7–7.7)
Neutrophils Relative %: 72 %
Platelets: 492 10*3/uL — ABNORMAL HIGH (ref 150–400)
RBC: 5.3 MIL/uL (ref 4.22–5.81)
RDW: 12.3 % (ref 11.5–15.5)
WBC: 10.4 10*3/uL (ref 4.0–10.5)
nRBC: 0 % (ref 0.0–0.2)

## 2022-11-07 MED ORDER — OXYCODONE-ACETAMINOPHEN 5-325 MG PO TABS
1.0000 | ORAL_TABLET | Freq: Once | ORAL | Status: AC
  Administered 2022-11-07: 1 via ORAL
  Filled 2022-11-07: qty 1

## 2022-11-07 MED ORDER — SODIUM CHLORIDE 0.9 % IV BOLUS
1000.0000 mL | Freq: Once | INTRAVENOUS | Status: AC
  Administered 2022-11-07: 1000 mL via INTRAVENOUS

## 2022-11-07 MED ORDER — OXYCODONE-ACETAMINOPHEN 5-325 MG PO TABS: 1.0000 | ORAL_TABLET | Freq: Four times a day (QID) | ORAL | 0 refills | Status: DC | PRN

## 2022-11-07 MED ORDER — KETOROLAC TROMETHAMINE 30 MG/ML IJ SOLN
15.0000 mg | Freq: Once | INTRAMUSCULAR | Status: AC
  Administered 2022-11-07: 15 mg via INTRAVENOUS
  Filled 2022-11-07: qty 1

## 2022-11-07 MED ORDER — NAPROXEN 500 MG PO TABS: 500.0000 mg | ORAL_TABLET | Freq: Two times a day (BID) | ORAL | 0 refills | Status: DC

## 2022-11-07 MED ORDER — HYDROMORPHONE HCL 1 MG/ML IJ SOLN
1.0000 mg | Freq: Once | INTRAMUSCULAR | Status: AC
  Administered 2022-11-07: 1 mg via INTRAVENOUS
  Filled 2022-11-07: qty 1

## 2022-11-07 MED ORDER — TAMSULOSIN HCL 0.4 MG PO CAPS: 0.4000 mg | ORAL_CAPSULE | Freq: Every day | ORAL | 0 refills | Status: DC

## 2022-11-07 MED ORDER — TAMSULOSIN HCL 0.4 MG PO CAPS
0.4000 mg | ORAL_CAPSULE | Freq: Once | ORAL | Status: AC
  Administered 2022-11-07: 0.4 mg via ORAL
  Filled 2022-11-07: qty 1

## 2022-11-07 MED ORDER — ONDANSETRON HCL 4 MG/2ML IJ SOLN
4.0000 mg | Freq: Once | INTRAMUSCULAR | Status: AC
  Administered 2022-11-07: 4 mg via INTRAVENOUS
  Filled 2022-11-07: qty 2

## 2022-11-07 NOTE — Discharge Instructions (Signed)
Make sure you are drinking plenty of fluids, strain your urine as discussed so you will know when this stone passes.  I am prescribing you a prescription strength anti-inflammatory pain reliever.  I recommend taking extra strength Tylenol in addition to this prescription, the 2 medications together can significantly reduce your pain symptoms without exposing you to narcotics.  Return here if you develop uncontrolled pain or if you develop uncontrolled vomiting or fever as the symptoms could suggest complications from this kidney stone.

## 2022-11-07 NOTE — ED Triage Notes (Signed)
Reports sudden onset of left flank pain 1 hour PTA. Denies any urinary symptoms, pain is constant and does not radiate.

## 2022-11-07 NOTE — ED Provider Notes (Signed)
[facility] Provider Note   CSN: [id number] Arrival date & time: 11/07/22  1308     History  Chief Complaint  Patient presents with   Flank Pain    Todd Lyons is a 22 y.o. male with a history including ADHD and epilepsy presenting for evaluation of sudden onset of left flank pain which started an hour prior to arrival.  He notes having a darker than normal urine yesterday evening which seems better today.  He denies increased urinary frequency, fevers or chills, he does have some nausea denies vomiting, worse with pain escalation.  He has had no treatment prior to arrival.  He does not have a history of kidney stones, but they do run in his family.  The history is provided by the patient.       Home Medications Prior to Admission medications   Medication Sig Start Date End Date Taking? Authorizing Provider  naproxen (NAPROSYN) 500 MG tablet Take 1 tablet (500 mg total) by mouth 2 (two) times daily. 11/07/22  Yes Danyel Griess, Almyra Free, PA-C  oxyCODONE-acetaminophen (PERCOCET/ROXICET) 5-325 MG tablet Take 1 tablet by mouth every 6 (six) hours as needed for severe pain. 11/07/22  Yes Laticia Vannostrand, Almyra Free, PA-C  oxyCODONE-acetaminophen (PERCOCET/ROXICET) 5-325 MG tablet Take 1 tablet by mouth every 6 (six) hours as needed for severe pain. 11/07/22  Yes Tyona Nilsen, Almyra Free, PA-C  tamsulosin (FLOMAX) 0.4 MG CAPS capsule Take 1 capsule (0.4 mg total) by mouth daily after supper. 11/07/22  Yes Rolan Wrightsman, Almyra Free, PA-C  albuterol (PROVENTIL HFA;VENTOLIN HFA) 108 (90 Base) MCG/ACT inhaler Inhale 1-2 puffs into the lungs every 6 (six) hours as needed for wheezing or shortness of breath.  04/29/16   [provider]  atomoxetine (STRATTERA) 80 MG capsule Take 100 mg by mouth at bedtime. 11/04/18   [provider]  cetirizine (ZYRTEC) 10 MG tablet Take 10 mg by mouth daily.    [provider]  LamoTRIgine 200 MG TB24 24 hour tablet Take 2 tablets by mouth at  bedtime. 07/03/21   [provider]  montelukast (SINGULAIR) 10 MG tablet Take 10 mg by mouth daily. 09/16/19   [provider]  NAYZILAM 5 MG/0.1ML SOLN  11/12/19   [provider]  phenylephrine-shark liver oil-mineral oil-petrolatum (PREPARATION H) 0.25-14-74.9 % rectal ointment Place 1 Application rectally 2 (two) times daily as needed for hemorrhoids. 06/04/22   Lynden Oxford Scales, PA-C  Psyllium (GOODSENSE PSYLLIUM FIBER) 51.7 % POWD Take 1 capsule by mouth every 12 (twelve) hours. 06/04/22   Lynden Oxford Scales, PA-C  sertraline (ZOLOFT) 25 MG tablet Take 25 mg by mouth daily.    [provider]  traZODone (DESYREL) 50 MG tablet Take 50 mg by mouth at bedtime. 07/15/21   [provider]  levETIRAcetam 500 MG TB3D Take 1/2 tablet twice daily for 7 days, 1 tablet twice daily for 7 days, then 1-1/2 tablets twice daily 10/01/19 08/03/20  Jodi Geralds, MD      Allergies    Morphine    Review of Systems   Review of Systems  Constitutional:  Negative for fever.  HENT:  Negative for congestion and sore throat.   Eyes: Negative.   Respiratory:  Negative for chest tightness and shortness of breath.   Cardiovascular:  Negative for chest pain.  Gastrointestinal:  Positive for nausea. Negative for abdominal pain.  Genitourinary:  Positive for flank pain. Negative for frequency and urgency.  Musculoskeletal:  Negative for arthralgias, joint  swelling and neck pain.  Skin: Negative.  Negative for rash and wound.  Neurological:  Negative for dizziness, weakness, light-headedness, numbness and headaches.  Psychiatric/Behavioral: Negative.    All other systems reviewed and are negative.   Physical Exam Updated Vital Signs BP 114/71   Pulse 84   Temp 98 F (36.7 C) (Oral)   Resp 15   Ht '5\' 10"'$  (1.778 m)   Wt 79.4 kg   SpO2 99%   BMI 25.11 kg/m  Physical Exam Vitals and nursing note reviewed.  Constitutional:      Appearance: He is  well-developed.  HENT:     Head: Normocephalic and atraumatic.  Eyes:     Conjunctiva/sclera: Conjunctivae normal.  Cardiovascular:     Rate and Rhythm: Normal rate and regular rhythm.     Heart sounds: Normal heart sounds.  Pulmonary:     Effort: Pulmonary effort is normal.     Breath sounds: Normal breath sounds. No wheezing.  Abdominal:     General: Bowel sounds are normal.     Palpations: Abdomen is soft.     Tenderness: There is no abdominal tenderness. There is left CVA tenderness.  Musculoskeletal:        General: Normal range of motion.     Cervical back: Normal range of motion.  Skin:    General: Skin is warm and dry.  Neurological:     Mental Status: He is alert.     ED Results / Procedures / Treatments   Labs (all labs ordered are listed, but only abnormal results are displayed) Labs Reviewed  BASIC METABOLIC PANEL - Abnormal; Notable for the following components:      Result Value   Potassium 3.3 (*)    CO2 21 (*)    Glucose, Bld 103 (*)    All other components within normal limits  CBC WITH DIFFERENTIAL/PLATELET - Abnormal; Notable for the following components:   Platelets 492 (*)    All other components within normal limits  URINALYSIS, ROUTINE W REFLEX MICROSCOPIC - Abnormal; Notable for the following components:   APPearance HAZY (*)    Hgb urine dipstick LARGE (*)    Bacteria, UA RARE (*)    All other components within normal limits    EKG None  Radiology No results found.  Procedures Procedures    Medications Ordered in ED Medications  HYDROmorphone (DILAUDID) injection 1 mg (1 mg Intravenous Given 11/07/22 1412)  ondansetron (ZOFRAN) injection 4 mg (4 mg Intravenous Given 11/07/22 1412)  ketorolac (TORADOL) 30 MG/ML injection 15 mg (15 mg Intravenous Given 11/07/22 1516)  sodium chloride 0.9 % bolus 1,000 mL (0 mLs Intravenous Stopped 11/07/22 1711)  tamsulosin (FLOMAX) capsule 0.4 mg (0.4 mg Oral Given 11/07/22 1812)   oxyCODONE-acetaminophen (PERCOCET/ROXICET) 5-325 MG per tablet 1 tablet (1 tablet Oral Given 11/07/22 1937)    ED Course/ Medical Decision Making/ A&P                             Medical Decision Making Sudden onset left flank pain suggesting kidney stone, also could suggest pyelonephritis, musculoskeletal source.  CT scan confirms 2 mm left ureteral stone.  He was given a urine strainer to strain all of his urine, referral to urology for follow-up care.  Pain medications and Flomax prescribed.  Strict return precautions were outlined.  Amount and/or Complexity of Data Reviewed Labs: ordered.    Details: Urine is significant for large amount of  hemoglobin.  His kidney function is normal range with a creatinine of 1.05.  WBC count of 10.4, hemoglobin of 15.6 Radiology: ordered.    Details: 2 mm calculus in the proximal left ureter.  Mild hydro.  Risk Prescription drug management.           Final Clinical Impression(s) / ED Diagnoses Final diagnoses:  Kidney stone    Rx / DC Orders ED Discharge Orders          Ordered    naproxen (NAPROSYN) 500 MG tablet  2 times daily        11/07/22 1805    tamsulosin (FLOMAX) 0.4 MG CAPS capsule  Daily after supper        11/07/22 1805    oxyCODONE-acetaminophen (PERCOCET/ROXICET) 5-325 MG tablet  Every 6 hours PRN        11/07/22 1908    oxyCODONE-acetaminophen (PERCOCET/ROXICET) 5-325 MG tablet  Every 6 hours PRN        11/07/22 1908              Landis Martins 11/10/22 2107    Milton Ferguson, MD 11/12/22 1535

## 2022-11-07 NOTE — ED Provider Triage Note (Signed)
Emergency Medicine Provider Triage Evaluation Note  Todd Lyons , a 22 y.o. male  was evaluated in triage.  Pt complains of left flank pain which started today.  He noted having dark urine yesterday, then today developed flank pain which started out suddenly but has escalated since it began.  He endorses nausea without emesis.  Denies fevers.  No kidney stone history..  Review of Systems  Positive: Flank pain, nausea, dark urine, no dysuria Negative: Fever, vomiting, abdominal pain  Physical Exam  BP (!) 135/93 (BP Location: Right Arm)   Pulse 100   Temp 98.1 F (36.7 C) (Oral)   Resp 18   Ht '5\' 10"'$  (1.778 m)   Wt 79.4 kg   SpO2 100%   BMI 25.11 kg/m  Gen:   Awake, no distress   Resp:  Normal effort  MSK:   Moves extremities without difficulty  Other:    Medical Decision Making  Medically screening exam initiated at 1:31 PM.  Appropriate orders placed.  Todd Lyons was informed that the remainder of the evaluation will be completed by another provider, this initial triage assessment does not replace that evaluation, and the importance of remaining in the ED until their evaluation is complete.     Evalee Jefferson, PA-C 11/07/22 1332

## 2022-11-08 MED FILL — Oxycodone w/ Acetaminophen Tab 5-325 MG: ORAL | Qty: 6 | Status: AC

## 2022-11-13 ENCOUNTER — Ambulatory Visit (INDEPENDENT_AMBULATORY_CARE_PROVIDER_SITE_OTHER): Payer: BC Managed Care – PPO | Admitting: Urology

## 2022-11-13 ENCOUNTER — Encounter: Payer: Self-pay | Admitting: Urology

## 2022-11-13 ENCOUNTER — Ambulatory Visit (HOSPITAL_COMMUNITY)
Admission: RE | Admit: 2022-11-13 | Discharge: 2022-11-13 | Disposition: A | Discharge status: Home / Self Care | Payer: BC Managed Care – PPO | Source: Ambulatory Visit | Attending: Urology | Admitting: Urology

## 2022-11-13 VITALS — BP 136/81 | HR 105

## 2022-11-13 DIAGNOSIS — N200 Calculus of kidney: Secondary | ICD-10-CM | POA: Diagnosis present

## 2022-11-13 MED ORDER — OXYCODONE-ACETAMINOPHEN 5-325 MG PO TABS: 1.0000 | ORAL_TABLET | Freq: Four times a day (QID) | ORAL | 0 refills | Status: DC | PRN

## 2022-11-13 MED ORDER — TAMSULOSIN HCL 0.4 MG PO CAPS: 0.4000 mg | ORAL_CAPSULE | Freq: Every day | ORAL | 0 refills | Status: DC

## 2022-11-13 NOTE — Patient Instructions (Signed)

## 2022-11-13 NOTE — Progress Notes (Signed)
11/13/2022 8:45 AM   Hetty Blend A Hipwell Mar 11, 2001 [id number]  Referring provider: Heywood Bene, PA-C [id number] [place],  [place] [address]  Left flank pain   HPI: Mr Tovar is a 22yo here for evaluation of nephrolithiasis. 1 week ago he developed sharp severe left flank pain and presented to the ER. He underwent CT and was diagnosed with a 37m left UPJ calculus an a 138mleft lower pole calculus. This is his first stone event. His mother has had 2 stones events int he past. Currently the pain is manageable wit percocet. He was placed on flomax in the ER. He has not passed his calculus. He denies any nausea or vomiting. KUB shows stable left proximal ureteral calculus   PMH: Past Medical History:  Diagnosis Date   ADHD (attention deficit hyperactivity disorder)    Environmental allergies    Seizures ([facility] [place]    Surgical History: Past Surgical History:  Procedure Laterality Date   APPENDECTOMY     LAPAROSCOPIC APPENDECTOMY N/A 08/29/2021   Procedure: APPENDECTOMY LAPAROSCOPIC;  Surgeon: BrVirl CageyMD;  Location: [facility];  Service: General;  Laterality: N/A;   TYMPANOSTOMY TUBE PLACEMENT      Home Medications:  Allergies as of 11/13/2022       Reactions   Morphine Rash        Medication List        Accurate as of November 13, 2022  8:45 AM. If you have any questions, ask your nurse or doctor.          albuterol 108 (90 Base) MCG/ACT inhaler Commonly known as: VENTOLIN HFA Inhale 1-2 puffs into the lungs every 6 (six) hours as needed for wheezing or shortness of breath.   atomoxetine 80 MG capsule Commonly known as: STRATTERA Take 100 mg by mouth at bedtime.   cetirizine 10 MG tablet Commonly known as: ZYRTEC Take 10 mg by mouth daily.   GoodSense Psyllium Fiber 51.7 % Powd Generic drug: Psyllium Take 1 capsule by mouth every 12 (twelve) hours.   LamoTRIgine 200 MG Tb24 24 hour tablet Take 2 tablets by mouth at bedtime.    montelukast 10 MG tablet Commonly known as: SINGULAIR Take 10 mg by mouth daily.   naproxen 500 MG tablet Commonly known as: NAPROSYN Take 1 tablet (500 mg total) by mouth 2 (two) times daily.   Nayzilam 5 MG/0.1ML Soln Generic drug: Midazolam   oxyCODONE-acetaminophen 5-325 MG tablet Commonly known as: PERCOCET/ROXICET Take 1 tablet by mouth every 6 (six) hours as needed for severe pain.   oxyCODONE-acetaminophen 5-325 MG tablet Commonly known as: PERCOCET/ROXICET Take 1 tablet by mouth every 6 (six) hours as needed for severe pain.   phenylephrine-shark liver oil-mineral oil-petrolatum 0.25-14-74.9 % rectal ointment Commonly known as: PREPARATION H Place 1 Application rectally 2 (two) times daily as needed for hemorrhoids.   sertraline 25 MG tablet Commonly known as: ZOLOFT Take 25 mg by mouth daily.   tamsulosin 0.4 MG Caps capsule Commonly known as: FLOMAX Take 1 capsule (0.4 mg total) by mouth daily after supper.   traZODone 50 MG tablet Commonly known as: DESYREL Take 50 mg by mouth at bedtime.        Allergies:  Allergies  Allergen Reactions   Morphine Rash    Family History: Family History  Problem Relation Age of Onset   Asthma Father    Asthma Paternal Grandmother    Allergic rhinitis Neg Hx    Angioedema Neg Hx  Atopy Neg Hx    Eczema Neg Hx    Immunodeficiency Neg Hx    Urticaria Neg Hx     Social History:  reports that he has never smoked. He has never used smokeless tobacco. He reports that he does not drink alcohol and does not use drugs.  ROS: All other review of systems were reviewed and are negative except what is noted above in HPI  Physical Exam: There were no vitals taken for this visit.  Constitutional:  Alert and oriented, No acute distress. HEENT: [place] AT, moist mucus membranes.  Trachea midline, no masses. Cardiovascular: No clubbing, cyanosis, or edema. Respiratory: Normal respiratory effort, no increased work of  breathing. GI: Abdomen is soft, nontender, nondistended, no abdominal masses GU: No CVA tenderness.  Lymph: No cervical or inguinal lymphadenopathy. Skin: No rashes, bruises or suspicious lesions. Neurologic: Grossly intact, no focal deficits, moving all 4 extremities. Psychiatric: Normal mood and affect.  Laboratory Data: Lab Results  Component Value Date   WBC 10.4 11/07/2022   HGB 15.6 11/07/2022   HCT 44.9 11/07/2022   MCV 84.7 11/07/2022   PLT 492 (H) 11/07/2022    Lab Results  Component Value Date   CREATININE 1.05 11/07/2022    No results found for: "PSA"  No results found for: "TESTOSTERONE"  No results found for: "HGBA1C"  Urinalysis    Component Value Date/Time   COLORURINE YELLOW 11/07/2022 1712   APPEARANCEUR HAZY (A) 11/07/2022 1712   LABSPEC 1.012 11/07/2022 1712   PHURINE 6.0 11/07/2022 1712   GLUCOSEU NEGATIVE 11/07/2022 1712   HGBUR LARGE (A) 11/07/2022 1712   BILIRUBINUR NEGATIVE 11/07/2022 1712   KETONESUR NEGATIVE 11/07/2022 1712   PROTEINUR NEGATIVE 11/07/2022 1712   NITRITE NEGATIVE 11/07/2022 1712   LEUKOCYTESUR NEGATIVE 11/07/2022 1712    Lab Results  Component Value Date   BACTERIA RARE (A) 11/07/2022    Pertinent Imaging: Ct 11/07/2022 and KUB today: Images reviewed and discussed with the patient  No results found for this or any previous visit.  No results found for this or any previous visit.  No results found for this or any previous visit.  No results found for this or any previous visit.  No results found for this or any previous visit.  No valid procedures specified. No results found for this or any previous visit.  Results for orders placed during the hospital encounter of 11/07/22  CT Renal Stone Study  Narrative CLINICAL DATA:  Sudden onset left flank pain.  EXAM: CT ABDOMEN AND PELVIS WITHOUT CONTRAST  TECHNIQUE: Multidetector CT imaging of the abdomen and pelvis was performed following the standard  protocol without IV contrast.  RADIATION DOSE REDUCTION: This exam was performed according to the departmental dose-optimization program which includes automated exposure control, adjustment of the mA and/or kV according to patient size and/or use of iterative reconstruction technique.  COMPARISON:  CT abdomen pelvis dated August 29, 2021.  FINDINGS: Lower chest: No acute abnormality.  Hepatobiliary: No focal liver abnormality is seen. No gallstones, gallbladder wall thickening, or biliary dilatation.  Pancreas: Unremarkable. No pancreatic ductal dilatation or surrounding inflammatory changes.  Spleen: Normal in size without focal abnormality.  Adrenals/Urinary Tract: The adrenal glands and right kidney are unremarkable. New 2 mm calculus in the proximal left ureter just beyond the left UPJ, with mild fullness of the left renal collecting system. The bladder is mostly decompressed.  Stomach/Bowel: Stomach is within normal limits. Prior appendectomy. No evidence of bowel wall thickening, distention, or inflammatory  changes.  Vascular/Lymphatic: No significant vascular findings are present. No enlarged abdominal or pelvic lymph nodes.  Reproductive: Prostate is unremarkable.  Other: No abdominal wall hernia or abnormality. No abdominopelvic ascites. No pneumoperitoneum.  Musculoskeletal: No acute or significant osseous findings.  IMPRESSION: 1. New 2 mm calculus in the proximal left ureter just beyond the UPJ. Mild fullness of the left renal collecting system without frank hydronephrosis.   Electronically Signed By: Titus Dubin M.D. On: 11/07/2022 14:12   Assessment & Plan:    1. Kidney stones -We discussed the management of kidney stones. These options include observation, ureteroscopy, shockwave lithotripsy (ESWL) and percutaneous nephrolithotomy (PCNL). We discussed which options are relevant to the patient's stone(s). We discussed the natural history of  kidney stones as well as the complications of untreated stones and the impact on quality of life without treatment as well as with each of the above listed treatments. We also discussed the efficacy of each treatment in its ability to clear the stone burden. With any of these management options I discussed the signs and symptoms of infection and the need for emergent treatment should these be experienced. For each option we discussed the ability of each procedure to clear the patient of their stone burden.   For observation I described the risks which include but are not limited to silent renal damage, life-threatening infection, need for emergent surgery, failure to pass stone and pain.   For ureteroscopy I described the risks which include bleeding, infection, damage to contiguous structures, positioning injury, ureteral stricture, ureteral avulsion, ureteral injury, need for prolonged ureteral stent, inability to perform ureteroscopy, need for an interval procedure, inability to clear stone burden, stent discomfort/pain, heart attack, stroke, pulmonary embolus and the inherent risks with general anesthesia.   For shockwave lithotripsy I described the risks which include arrhythmia, kidney contusion, kidney hemorrhage, need for transfusion, pain, inability to adequately break up stone, inability to pass stone fragments, Steinstrasse, infection associated with obstructing stones, need for alternate surgical procedure, need for repeat shockwave lithotripsy, MI, CVA, PE and the inherent risks with anesthesia/conscious sedation.   For PCNL I described the risks including positioning injury, pneumothorax, hydrothorax, need for chest tube, inability to clear stone burden, renal laceration, arterial venous fistula or malformation, need for embolization of kidney, loss of kidney or renal function, need for repeat procedure, need for prolonged nephrostomy tube, ureteral avulsion, MI, CVA, PE and the inherent risks  of general anesthesia.   - The patient would like to proceed with medical expulsive therapy. Followup 1 week with KUB - Urinalysis, Routine w reflex microscopic   No follow-ups on file.  Nicolette Bang, MD  [facility] [place]

## 2022-11-14 ENCOUNTER — Encounter: Payer: Self-pay | Admitting: Radiology

## 2022-11-14 LAB — URINALYSIS, ROUTINE W REFLEX MICROSCOPIC
Bilirubin, UA: NEGATIVE
Glucose, UA: NEGATIVE
Ketones, UA: NEGATIVE
Nitrite, UA: NEGATIVE
Protein,UA: NEGATIVE
Specific Gravity, UA: 1.02 (ref 1.005–1.030)
Urobilinogen, Ur: 0.2 mg/dL (ref 0.2–1.0)
pH, UA: 7 (ref 5.0–7.5)

## 2022-11-14 LAB — MICROSCOPIC EXAMINATION: Bacteria, UA: NONE SEEN

## 2022-11-20 ENCOUNTER — Ambulatory Visit (INDEPENDENT_AMBULATORY_CARE_PROVIDER_SITE_OTHER): Payer: BC Managed Care – PPO | Admitting: Urology

## 2022-11-20 ENCOUNTER — Ambulatory Visit (HOSPITAL_COMMUNITY)
Admission: RE | Admit: 2022-11-20 | Discharge: 2022-11-20 | Disposition: A | Discharge status: Home / Self Care | Payer: BC Managed Care – PPO | Source: Ambulatory Visit | Attending: Urology | Admitting: Urology

## 2022-11-20 VITALS — BP 131/82 | HR 116

## 2022-11-20 DIAGNOSIS — N200 Calculus of kidney: Secondary | ICD-10-CM | POA: Insufficient documentation

## 2022-11-20 LAB — URINALYSIS, ROUTINE W REFLEX MICROSCOPIC
Bilirubin, UA: NEGATIVE
Glucose, UA: NEGATIVE
Ketones, UA: NEGATIVE
Leukocytes,UA: NEGATIVE
Nitrite, UA: NEGATIVE
Protein,UA: NEGATIVE
Specific Gravity, UA: 1.03 (ref 1.005–1.030)
Urobilinogen, Ur: 0.2 mg/dL (ref 0.2–1.0)
pH, UA: 5.5 (ref 5.0–7.5)

## 2022-11-20 LAB — MICROSCOPIC EXAMINATION: Bacteria, UA: NONE SEEN

## 2022-11-20 NOTE — Progress Notes (Signed)
11/20/2022 10:10 AM   Todd Lyons 2001-02-18 [id number]  Referring provider: Heywood Bene, PA-C [address] [place],  [place] [address]  Followup nephrolithiasis   HPI: Todd Lyons is a 22yo here for followup for nephrolithiasis. He has not passed has calculus. He denies any flank pain currently. No significant LUTS. No hematuria or dysuria. No other complaints today   PMH: Past Medical History:  Diagnosis Date   ADHD (attention deficit hyperactivity disorder)    Environmental allergies    Seizures ([place])     Surgical History: Past Surgical History:  Procedure Laterality Date   APPENDECTOMY     LAPAROSCOPIC APPENDECTOMY N/A 08/29/2021   Procedure: APPENDECTOMY LAPAROSCOPIC;  Surgeon: Virl Cagey, MD;  Location: [facility];  Service: General;  Laterality: N/A;   TYMPANOSTOMY TUBE PLACEMENT      Home Medications:  Allergies as of 11/20/2022       Reactions   Morphine Rash        Medication List        Accurate as of November 20, 2022 10:10 AM. If you have any questions, ask your nurse or doctor.          albuterol 108 (90 Base) MCG/ACT inhaler Commonly known as: VENTOLIN HFA Inhale 1-2 puffs into the lungs every 6 (six) hours as needed for wheezing or shortness of breath.   atomoxetine 80 MG capsule Commonly known as: STRATTERA Take 100 mg by mouth at bedtime.   cetirizine 10 MG tablet Commonly known as: ZYRTEC Take 10 mg by mouth daily.   GoodSense Psyllium Fiber 51.7 % Powd Generic drug: Psyllium Take 1 capsule by mouth every 12 (twelve) hours.   LamoTRIgine 200 MG Tb24 24 hour tablet Take 2 tablets by mouth at bedtime.   montelukast 10 MG tablet Commonly known as: SINGULAIR Take 10 mg by mouth daily.   naproxen 500 MG tablet Commonly known as: NAPROSYN Take 1 tablet (500 mg total) by mouth 2 (two) times daily.   Nayzilam 5 MG/0.1ML Soln Generic drug: Midazolam   oxyCODONE-acetaminophen 5-325 MG tablet Commonly known  as: PERCOCET/ROXICET Take 1 tablet by mouth every 6 (six) hours as needed for severe pain.   phenylephrine-shark liver oil-mineral oil-petrolatum 0.25-14-74.9 % rectal ointment Commonly known as: PREPARATION H Place 1 Application rectally 2 (two) times daily as needed for hemorrhoids.   sertraline 25 MG tablet Commonly known as: ZOLOFT Take 25 mg by mouth daily.   tamsulosin 0.4 MG Caps capsule Commonly known as: FLOMAX Take 1 capsule (0.4 mg total) by mouth daily after supper.   traZODone 50 MG tablet Commonly known as: DESYREL Take 50 mg by mouth at bedtime.        Allergies:  Allergies  Allergen Reactions   Morphine Rash    Family History: Family History  Problem Relation Age of Onset   Asthma Father    Asthma Paternal Grandmother    Allergic rhinitis Neg Hx    Angioedema Neg Hx    Atopy Neg Hx    Eczema Neg Hx    Immunodeficiency Neg Hx    Urticaria Neg Hx     Social History:  reports that he has never smoked. He has never used smokeless tobacco. He reports that he does not drink alcohol and does not use drugs.  ROS: All other review of systems were reviewed and are negative except what is noted above in HPI  Physical Exam: BP 131/82   Pulse (!) 116  Constitutional:  Alert and oriented, No acute distress. HEENT: [place] AT, moist mucus membranes.  Trachea midline, no masses. Cardiovascular: No clubbing, cyanosis, or edema. Respiratory: Normal respiratory effort, no increased work of breathing. GI: Abdomen is soft, nontender, nondistended, no abdominal masses GU: No CVA tenderness.  Lymph: No cervical or inguinal lymphadenopathy. Skin: No rashes, bruises or suspicious lesions. Neurologic: Grossly intact, no focal deficits, moving all 4 extremities. Psychiatric: Normal mood and affect.  Laboratory Data: Lab Results  Component Value Date   WBC 10.4 11/07/2022   HGB 15.6 11/07/2022   HCT 44.9 11/07/2022   MCV 84.7 11/07/2022   PLT 492 (H) 11/07/2022     Lab Results  Component Value Date   CREATININE 1.05 11/07/2022    No results found for: "PSA"  No results found for: "TESTOSTERONE"  No results found for: "HGBA1C"  Urinalysis    Component Value Date/Time   COLORURINE YELLOW 11/07/2022 1712   APPEARANCEUR Hazy (A) 11/13/2022 0900   LABSPEC 1.012 11/07/2022 1712   PHURINE 6.0 11/07/2022 1712   GLUCOSEU Negative 11/13/2022 0900   HGBUR LARGE (A) 11/07/2022 1712   BILIRUBINUR Negative 11/13/2022 0900   KETONESUR NEGATIVE 11/07/2022 1712   PROTEINUR Negative 11/13/2022 0900   PROTEINUR NEGATIVE 11/07/2022 1712   NITRITE Negative 11/13/2022 0900   NITRITE NEGATIVE 11/07/2022 1712   LEUKOCYTESUR Trace (A) 11/13/2022 0900   LEUKOCYTESUR NEGATIVE 11/07/2022 1712    Lab Results  Component Value Date   LABMICR See below: 11/13/2022   WBCUA 6-10 (A) 11/13/2022   LABEPIT 0-10 11/13/2022   BACTERIA None seen 11/13/2022    Pertinent Imaging: KUb today: Images reviewed and discussed with the patient  Results for orders placed in visit on 11/13/22  Abdomen 1 view (KUB)  Narrative CLINICAL DATA:  nephrolithiasis  EXAM: ABDOMEN - 1 VIEW  COMPARISON:  11/07/2022  FINDINGS: Renal shadows are partially obscured by stool and gas pattern. Compared to the renal CT of 11/07/2022, no visualized radiopaque urinary tract calculi by plain radiography. No acute osseous finding.  IMPRESSION: No significant finding by plain radiography   Electronically Signed By: Jerilynn Mages.  Shick M.D. On: 11/14/2022 09:25  No results found for this or any previous visit.  No results found for this or any previous visit.  No results found for this or any previous visit.  No results found for this or any previous visit.  No valid procedures specified. No results found for this or any previous visit.  Results for orders placed during the hospital encounter of 11/07/22  CT Renal Stone Study  Narrative CLINICAL DATA:  Sudden onset left  flank pain.  EXAM: CT ABDOMEN AND PELVIS WITHOUT CONTRAST  TECHNIQUE: Multidetector CT imaging of the abdomen and pelvis was performed following the standard protocol without IV contrast.  RADIATION DOSE REDUCTION: This exam was performed according to the departmental dose-optimization program which includes automated exposure control, adjustment of the mA and/or kV according to patient size and/or use of iterative reconstruction technique.  COMPARISON:  CT abdomen pelvis dated August 29, 2021.  FINDINGS: Lower chest: No acute abnormality.  Hepatobiliary: No focal liver abnormality is seen. No gallstones, gallbladder wall thickening, or biliary dilatation.  Pancreas: Unremarkable. No pancreatic ductal dilatation or surrounding inflammatory changes.  Spleen: Normal in size without focal abnormality.  Adrenals/Urinary Tract: The adrenal glands and right kidney are unremarkable. New 2 mm calculus in the proximal left ureter just beyond the left UPJ, with mild fullness of the left renal collecting system. The bladder  is mostly decompressed.  Stomach/Bowel: Stomach is within normal limits. Prior appendectomy. No evidence of bowel wall thickening, distention, or inflammatory changes.  Vascular/Lymphatic: No significant vascular findings are present. No enlarged abdominal or pelvic lymph nodes.  Reproductive: Prostate is unremarkable.  Other: No abdominal wall hernia or abnormality. No abdominopelvic ascites. No pneumoperitoneum.  Musculoskeletal: No acute or significant osseous findings.  IMPRESSION: 1. New 2 mm calculus in the proximal left ureter just beyond the UPJ. Mild fullness of the left renal collecting system without frank hydronephrosis.   Electronically Signed By: Titus Dubin M.D. On: 11/07/2022 14:12   Assessment & Plan:    1. Kidney stones Continue medical expulsive therapy. -followup 3 weeks with KUB - Urinalysis, Routine w reflex  microscopic   No follow-ups on file.  Nicolette Bang, MD  [facility] [place]

## 2022-11-22 ENCOUNTER — Encounter: Payer: Self-pay | Admitting: Urology

## 2022-11-22 NOTE — Patient Instructions (Signed)

## 2022-12-06 ENCOUNTER — Encounter: Payer: Self-pay | Admitting: Urology

## 2022-12-06 ENCOUNTER — Ambulatory Visit (HOSPITAL_COMMUNITY)
Admission: RE | Admit: 2022-12-06 | Discharge: 2022-12-06 | Disposition: A | Discharge status: Home / Self Care | Payer: BC Managed Care – PPO | Source: Ambulatory Visit | Attending: Urology | Admitting: Urology

## 2022-12-06 ENCOUNTER — Ambulatory Visit (INDEPENDENT_AMBULATORY_CARE_PROVIDER_SITE_OTHER): Payer: BC Managed Care – PPO | Admitting: Urology

## 2022-12-06 ENCOUNTER — Ambulatory Visit: Payer: BC Managed Care – PPO | Admitting: Urology

## 2022-12-06 VITALS — BP 124/83 | HR 92 | Ht 70.0 in | Wt 175.0 lb

## 2022-12-06 DIAGNOSIS — N200 Calculus of kidney: Secondary | ICD-10-CM

## 2022-12-06 LAB — MICROSCOPIC EXAMINATION

## 2022-12-06 LAB — URINALYSIS, ROUTINE W REFLEX MICROSCOPIC
Bilirubin, UA: NEGATIVE
Glucose, UA: NEGATIVE
Ketones, UA: NEGATIVE
Nitrite, UA: NEGATIVE
Protein,UA: NEGATIVE
Specific Gravity, UA: 1.03 (ref 1.005–1.030)
Urobilinogen, Ur: 0.2 mg/dL (ref 0.2–1.0)
pH, UA: 5 (ref 5.0–7.5)

## 2022-12-06 NOTE — Progress Notes (Signed)
12/06/2022 10:29 AM   Todd Lyons [birth year] [id number]  Referring provider: Heywood Bene, PA-C [address] [place],  [place] [id number]  nephrolithiasis   HPI: Mr Todd Lyons is a 22yo here for followup for nephrolithiasis. He passed his calculus. He denies any flank pain. He drinks under 20oz of water daily. He adds salt to his food and eats a lot of red meat. No other complaints today   PMH: Past Medical History:  Diagnosis Date   ADHD (attention deficit hyperactivity disorder)    Environmental allergies    Seizures (Todd Lyons)     Surgical History: Past Surgical History:  Procedure Laterality Date   APPENDECTOMY     LAPAROSCOPIC APPENDECTOMY N/A 08/29/2021   Procedure: APPENDECTOMY LAPAROSCOPIC;  Surgeon: Virl Cagey, MD;  Location: [facility];  Service: General;  Laterality: N/A;   TYMPANOSTOMY TUBE PLACEMENT      Home Medications:  Allergies as of 12/06/2022       Reactions   Morphine Rash        Medication List        Accurate as of December 06, 2022 10:29 AM. If you have any questions, ask your nurse or doctor.          albuterol 108 (90 Base) MCG/ACT inhaler Commonly known as: VENTOLIN HFA Inhale 1-2 puffs into the lungs every 6 (six) hours as needed for wheezing or shortness of breath.   atomoxetine 80 MG capsule Commonly known as: STRATTERA Take 100 mg by mouth at bedtime.   cetirizine 10 MG tablet Commonly known as: ZYRTEC Take 10 mg by mouth daily.   GoodSense Psyllium Fiber 51.7 % Powd Generic drug: Psyllium Take 1 capsule by mouth every 12 (twelve) hours.   LamoTRIgine 200 MG Tb24 24 hour tablet Take 2 tablets by mouth at bedtime.   montelukast 10 MG tablet Commonly known as: SINGULAIR Take 10 mg by mouth daily.   naproxen 500 MG tablet Commonly known as: NAPROSYN Take 1 tablet (500 mg total) by mouth 2 (two) times daily.   Nayzilam 5 MG/0.1ML Soln Generic drug: Midazolam   oxyCODONE-acetaminophen 5-325 MG  tablet Commonly known as: PERCOCET/ROXICET Take 1 tablet by mouth every 6 (six) hours as needed for severe pain.   phenylephrine-shark liver oil-mineral oil-petrolatum 0.25-14-74.9 % rectal ointment Commonly known as: PREPARATION H Place 1 Application rectally 2 (two) times daily as needed for hemorrhoids.   sertraline 25 MG tablet Commonly known as: ZOLOFT Take 25 mg by mouth daily.   tamsulosin 0.4 MG Caps capsule Commonly known as: FLOMAX Take 1 capsule (0.4 mg total) by mouth daily after supper.   traZODone 50 MG tablet Commonly known as: DESYREL Take 50 mg by mouth at bedtime.        Allergies:  Allergies  Allergen Reactions   Morphine Rash    Family History: Family History  Problem Relation Age of Onset   Asthma Father    Asthma Paternal Grandmother    Allergic rhinitis Neg Hx    Angioedema Neg Hx    Atopy Neg Hx    Eczema Neg Hx    Immunodeficiency Neg Hx    Urticaria Neg Hx     Social History:  reports that he has never smoked. He has never used smokeless tobacco. He reports that he does not drink alcohol and does not use drugs.  ROS: All other review of systems were reviewed and are negative except what is noted above in HPI  Physical Exam:  BP 124/83   Pulse 92   Ht 5\' 10"  (1.778 m)   Wt 175 lb (79.4 kg)   BMI 25.11 kg/m   Constitutional:  Alert and oriented, No acute distress. HEENT: Todd Lyons AT, moist mucus membranes.  Trachea midline, no masses. Cardiovascular: No clubbing, cyanosis, or edema. Respiratory: Normal respiratory effort, no increased work of breathing. GI: Abdomen is soft, nontender, nondistended, no abdominal masses GU: No CVA tenderness.  Lymph: No cervical or inguinal lymphadenopathy. Skin: No rashes, bruises or suspicious lesions. Neurologic: Grossly intact, no focal deficits, moving all 4 extremities. Psychiatric: Normal mood and affect.  Laboratory Data: Lab Results  Component Value Date   WBC 10.4 11/07/2022   HGB 15.6  11/07/2022   HCT 44.9 11/07/2022   MCV 84.7 11/07/2022   PLT 492 (H) 11/07/2022    Lab Results  Component Value Date   CREATININE 1.05 11/07/2022    No results found for: "PSA"  No results found for: "TESTOSTERONE"  No results found for: "HGBA1C"  Urinalysis    Component Value Date/Time   COLORURINE YELLOW 11/07/2022 1712   APPEARANCEUR Clear 11/20/2022 0943   LABSPEC 1.012 11/07/2022 1712   PHURINE 6.0 11/07/2022 1712   GLUCOSEU Negative 11/20/2022 0943   HGBUR LARGE (A) 11/07/2022 1712   BILIRUBINUR Negative 11/20/2022 Todd Lyons 11/07/2022 1712   PROTEINUR Negative 11/20/2022 0943   PROTEINUR NEGATIVE 11/07/2022 1712   NITRITE Negative 11/20/2022 0943   NITRITE NEGATIVE 11/07/2022 1712   LEUKOCYTESUR Negative 11/20/2022 0943   LEUKOCYTESUR NEGATIVE 11/07/2022 1712    Lab Results  Component Value Date   LABMICR See below: 11/20/2022   WBCUA 0-5 11/20/2022   LABEPIT 0-10 11/20/2022   BACTERIA None seen 11/20/2022    Pertinent Imaging: Kub today: Images reviewed and discussed with the patient  Results for orders placed during the hospital encounter of 11/20/22  Abdomen 1 view (KUB)  Narrative CLINICAL DATA:  History of left ureteral stone  EXAM: ABDOMEN - 1 VIEW  COMPARISON:  11/13/2022  FINDINGS: Scattered large and small bowel gas is noted. No calculi are seen. No bony abnormality is noted.  IMPRESSION: No acute abnormality noted.   Electronically Signed By: Todd Lyons M.D. On: 11/21/2022 03:11  No results found for this or any previous visit.  No results found for this or any previous visit.  No results found for this or any previous visit.  No results found for this or any previous visit.  No valid procedures specified. No results found for this or any previous visit.  Results for orders placed during the hospital encounter of 11/07/22  CT Renal Stone Study  Narrative CLINICAL DATA:  Sudden onset left flank  pain.  EXAM: CT ABDOMEN AND PELVIS WITHOUT CONTRAST  TECHNIQUE: Multidetector CT imaging of the abdomen and pelvis was performed following the standard protocol without IV contrast.  RADIATION DOSE REDUCTION: This exam was performed according to the departmental dose-optimization program which includes automated exposure control, adjustment of the mA and/or kV according to patient size and/or use of iterative reconstruction technique.  COMPARISON:  CT abdomen pelvis dated August 29, 2021.  FINDINGS: Lower chest: No acute abnormality.  Hepatobiliary: No focal liver abnormality is seen. No gallstones, gallbladder wall thickening, or biliary dilatation.  Pancreas: Unremarkable. No pancreatic ductal dilatation or surrounding inflammatory changes.  Spleen: Normal in size without focal abnormality.  Adrenals/Urinary Tract: The adrenal glands and right kidney are unremarkable. New 2 mm calculus in the proximal left ureter just  beyond the left UPJ, with mild fullness of the left renal collecting system. The bladder is mostly decompressed.  Stomach/Bowel: Stomach is within normal limits. Prior appendectomy. No evidence of bowel wall thickening, distention, or inflammatory changes.  Vascular/Lymphatic: No significant vascular findings are present. No enlarged abdominal or pelvic lymph nodes.  Reproductive: Prostate is unremarkable.  Other: No abdominal wall hernia or abnormality. No abdominopelvic ascites. No pneumoperitoneum.  Musculoskeletal: No acute or significant osseous findings.  IMPRESSION: 1. New 2 mm calculus in the proximal left ureter just beyond the UPJ. Mild fullness of the left renal collecting system without frank hydronephrosis.   Electronically Signed By: Titus Dubin M.D. On: 11/07/2022 14:12   Assessment & Plan:    1. Kidney stones -Followup 6 months with KUB - Urinalysis, Routine w reflex microscopic - Calculi, with Photograph (to Clinical  Lab)   No follow-ups on file.  Nicolette Bang, MD  [facility] [place]

## 2022-12-06 NOTE — Patient Instructions (Signed)

## 2022-12-13 LAB — CALCULI, WITH PHOTOGRAPH (CLINICAL LAB)
Calcium Oxalate Dihydrate: 80 %
Calcium Oxalate Monohydrate: 20 %
Weight Calculi: 11 mg

## 2023-05-25 ENCOUNTER — Other Ambulatory Visit: Payer: Self-pay

## 2023-05-25 ENCOUNTER — Encounter (HOSPITAL_COMMUNITY): Payer: Self-pay

## 2023-05-25 ENCOUNTER — Emergency Department (HOSPITAL_COMMUNITY)
Admission: EM | Admit: 2023-05-25 | Discharge: 2023-05-25 | Disposition: A | Discharge status: Home / Self Care | Payer: BC Managed Care – PPO | Attending: Emergency Medicine | Admitting: Emergency Medicine

## 2023-05-25 DIAGNOSIS — F410 Panic disorder [episodic paroxysmal anxiety] without agoraphobia: Secondary | ICD-10-CM | POA: Diagnosis present

## 2023-05-25 MED ORDER — HYDROXYZINE HCL 25 MG PO TABS: 25.0000 mg | ORAL_TABLET | Freq: Three times a day (TID) | ORAL | 0 refills | Status: DC | PRN

## 2023-05-25 NOTE — Discharge Instructions (Signed)
Your testing today shows a normal heart, it sounds like you had a panic attack, you may use hydroxyzine for severe panic attack, do not drive if taking this medication, ER for worsening symptoms, see the phone number for behavioral health and make an appointment to be seen a soon as possible
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
## 2023-06-06 ENCOUNTER — Ambulatory Visit: Payer: BC Managed Care – PPO | Admitting: Urology

## 2023-06-07 ENCOUNTER — Other Ambulatory Visit: Payer: Self-pay

## 2023-06-07 ENCOUNTER — Emergency Department (HOSPITAL_COMMUNITY): Payer: BC Managed Care – PPO

## 2023-06-07 ENCOUNTER — Encounter (HOSPITAL_COMMUNITY): Payer: Self-pay

## 2023-06-07 ENCOUNTER — Emergency Department (HOSPITAL_COMMUNITY)
Admission: EM | Admit: 2023-06-07 | Discharge: 2023-06-07 | Disposition: A | Discharge status: Home / Self Care | Payer: BC Managed Care – PPO | Attending: Emergency Medicine | Admitting: Emergency Medicine

## 2023-06-07 DIAGNOSIS — E871 Hypo-osmolality and hyponatremia: Secondary | ICD-10-CM | POA: Insufficient documentation

## 2023-06-07 DIAGNOSIS — R002 Palpitations: Secondary | ICD-10-CM | POA: Diagnosis present

## 2023-06-07 DIAGNOSIS — R000 Tachycardia, unspecified: Secondary | ICD-10-CM | POA: Insufficient documentation

## 2023-06-07 LAB — CBC
HCT: 45.5 % (ref 39.0–52.0)
Hemoglobin: 15.4 g/dL (ref 13.0–17.0)
MCH: 29.4 pg (ref 26.0–34.0)
MCHC: 33.8 g/dL (ref 30.0–36.0)
MCV: 86.8 fL (ref 80.0–100.0)
Platelets: 411 10*3/uL — ABNORMAL HIGH (ref 150–400)
RBC: 5.24 MIL/uL (ref 4.22–5.81)
RDW: 12.6 % (ref 11.5–15.5)
WBC: 4.9 10*3/uL (ref 4.0–10.5)
nRBC: 0 % (ref 0.0–0.2)

## 2023-06-07 LAB — BASIC METABOLIC PANEL
Anion gap: 11 (ref 5–15)
BUN: 11 mg/dL (ref 6–20)
CO2: 20 mmol/L — ABNORMAL LOW (ref 22–32)
Calcium: 9.3 mg/dL (ref 8.9–10.3)
Chloride: 103 mmol/L (ref 98–111)
Creatinine, Ser: 1.07 mg/dL (ref 0.61–1.24)
GFR, Estimated: >60 mL/min (ref >60)
Glucose, Bld: 104 mg/dL — ABNORMAL HIGH (ref 70–99)
Potassium: 3.8 mmol/L (ref 3.5–5.1)
Sodium: 134 mmol/L — ABNORMAL LOW (ref 135–145)

## 2023-06-07 LAB — TROPONIN I (HIGH SENSITIVITY): Troponin I (High Sensitivity): <2 ng/L (ref <18)

## 2023-06-07 MED ORDER — LORAZEPAM 1 MG PO TABS
0.5000 mg | ORAL_TABLET | Freq: Once | ORAL | Status: AC
  Administered 2023-06-07: 0.5 mg via ORAL
  Filled 2023-06-07: qty 1

## 2023-06-07 NOTE — Discharge Instructions (Addendum)
I am glad you are feeling better.  Lab workup today was reassuring.  As discussed, please follow-up with your primary care provider during your appointment in 2 days.  Seek emergency care if experiencing any new or worsening symptoms.

## 2023-06-07 NOTE — ED Triage Notes (Signed)
Pt c/o tachycardia and left arm painx30 mins. Pt denies chest pain or SOB

## 2023-06-07 NOTE — ED Provider Notes (Signed)
[place] EMERGENCY DEPARTMENT AT [facility] Provider Note   CSN: [id number] Arrival date & time: 06/07/23  1002     History  Chief Complaint  Patient presents with   Tachycardia    Todd Lyons is a 22 y.o. male with PMHx ADHD who presents to ED concerned for palpitations. 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 Patient states that these panic attacks involve tunnel vision and palpitations. Patient stating that he is coming to ED for a few tests for reassurance. Patient stating that he has a follow up apt with PCP on Monday where he is starting Zoloft.  Denies LOC, head trauma, seizures. Denies fever, chest pain, dyspnea, cough, nausea, vomiting, diarrhea, dysuria, hematuria, hematochezia. Denies recent surgery/immobilization, hx DVT/PE, hemoptysis, hx cancer in the past 6 months, calf swelling/tenderness.      HPI     Home Medications Prior to Admission medications   Medication Sig Start Date End Date Taking? Authorizing Provider  albuterol (PROVENTIL HFA;VENTOLIN HFA) 108 (90 Base) MCG/ACT inhaler Inhale 1-2 puffs into the lungs every 6 (six) hours as needed for wheezing or shortness of breath.  04/29/16   [provider]  atomoxetine (STRATTERA) 80 MG capsule Take 100 mg by mouth at bedtime. 11/04/18   [provider]  cetirizine (ZYRTEC) 10 MG tablet Take 10 mg by mouth daily.    [provider]  hydrOXYzine (ATARAX) 25 MG tablet Take 1 tablet (25 mg total) by mouth every 8 (eight) hours as needed for anxiety. 05/25/23   Eber Hong, MD  LamoTRIgine 200 MG TB24 24 hour tablet Take 2 tablets by mouth at bedtime. 07/03/21   [provider]  montelukast (SINGULAIR) 10 MG tablet Take 10 mg by mouth daily. 09/16/19   [provider]  naproxen (NAPROSYN) 500 MG tablet Take 1 tablet (500 mg total) by mouth 2 (two) times daily.  11/07/22   Burgess Amor, PA-C  NAYZILAM 5 MG/0.1ML SOLN  11/12/19   [provider]  oxyCODONE-acetaminophen (PERCOCET/ROXICET) 5-325 MG tablet Take 1 tablet by mouth every 6 (six) hours as needed for severe pain. 11/13/22   McKenzie, Mardene Celeste, MD  phenylephrine-shark liver oil-mineral oil-petrolatum (PREPARATION H) 0.25-14-74.9 % rectal ointment Place 1 Application rectally 2 (two) times daily as needed for hemorrhoids. 06/04/22   Theadora Rama Scales, PA-C  Psyllium (GOODSENSE PSYLLIUM FIBER) 51.7 % POWD Take 1 capsule by mouth every 12 (twelve) hours. 06/04/22   Theadora Rama Scales, PA-C  sertraline (ZOLOFT) 25 MG tablet Take 25 mg by mouth daily.    [provider]  tamsulosin (FLOMAX) 0.4 MG CAPS capsule Take 1 capsule (0.4 mg total) by mouth daily after supper. 11/13/22   McKenzie, Mardene Celeste, MD  traZODone (DESYREL) 50 MG tablet Take 50 mg by mouth at bedtime. 07/15/21   [provider]  levETIRAcetam 500 MG TB3D Take 1/2 tablet twice daily for 7 days, 1 tablet twice daily for 7 days, then 1-1/2 tablets twice daily 10/01/19 08/03/20  Deetta Perla, MD      Allergies    Morphine    Review of Systems   Review of Systems  Cardiovascular:  Positive for palpitations.    Physical Exam Updated Vital Signs BP 117/77   Pulse 70   Temp 98.4 F (36.9 C) (Oral)   Resp 11   Ht 5\' 10"  (1.778 m)   Wt 81.6 kg  SpO2 100%   BMI 25.81 kg/m  Physical Exam Vitals and nursing note reviewed.  Constitutional:      General: He is not in acute distress.    Appearance: He is not ill-appearing or toxic-appearing.  HENT:     Head: Normocephalic and atraumatic.     Mouth/Throat:     Mouth: Mucous membranes are moist.  Eyes:     General: No scleral icterus.       Right eye: No discharge.        Left eye: No discharge.     Conjunctiva/sclera: Conjunctivae normal.  Cardiovascular:     Rate and Rhythm: Normal rate and regular rhythm.     Pulses: Normal pulses.      Heart sounds: Normal heart sounds. No murmur heard.    Comments: +2 radial pulses are symmetric. No swelling or pitting edema of BL LE. No calf tenderness. Pulmonary:     Effort: Pulmonary effort is normal. No respiratory distress.     Breath sounds: Normal breath sounds. No wheezing, rhonchi or rales.  Abdominal:     General: Abdomen is flat. Bowel sounds are normal.     Palpations: Abdomen is soft.     Tenderness: There is no abdominal tenderness.  Musculoskeletal:     Right lower leg: No edema.     Left lower leg: No edema.  Skin:    General: Skin is warm and dry.     Findings: No rash.  Neurological:     General: No focal deficit present.     Mental Status: He is alert. Mental status is at baseline.  Psychiatric:        Mood and Affect: Mood normal.     ED Results / Procedures / Treatments   Labs (all labs ordered are listed, but only abnormal results are displayed) Labs Reviewed  BASIC METABOLIC PANEL - Abnormal; Notable for the following components:      Result Value   Sodium 134 (*)    CO2 20 (*)    Glucose, Bld 104 (*)    All other components within normal limits  CBC - Abnormal; Notable for the following components:   Platelets 411 (*)    All other components within normal limits  TROPONIN I (HIGH SENSITIVITY)  TROPONIN I (HIGH SENSITIVITY)    EKG EKG Interpretation Date/Time:  Saturday June 07 2023 10:10:24 EDT Ventricular Rate:  98 PR Interval:  132 QRS Duration:  108 QT Interval:  342 QTC Calculation: 436 R Axis:   96  Text Interpretation: Normal sinus rhythm Rightward axis Borderline ECG No significant change since last tracing Confirmed by Fulton Reek [phone]) on 06/07/2023 11:24:40 AM  Radiology DG Chest 2 View  Result Date: 06/07/2023 CLINICAL DATA:  Chest pain. EXAM: CHEST - 2 VIEW COMPARISON:  08/10/2019. FINDINGS: Bilateral lung fields are clear. Bilateral costophrenic angles are clear. Normal cardio-mediastinal silhouette. No acute  osseous abnormalities. The soft tissues are within normal limits. IMPRESSION: No active cardiopulmonary disease. Electronically Signed   By: Jules Schick M.D.   On: 06/07/2023 10:33    Procedures Procedures    Medications Ordered in ED Medications  LORazepam (ATIVAN) tablet 0.5 mg (0.5 mg Oral Given 06/07/23 1142)    ED Course/ Medical Decision Making/ A&P                                 Medical Decision Making Amount and/or Complexity of Data  Reviewed Labs: ordered. Radiology: ordered.  Risk Prescription drug management.   This patient presents to the ED for concern of palpitations, this involves an extensive number of treatment options, and is a complaint that carries with it a high risk of complications and morbidity.  The differential diagnosis includes acute coronary syndrome, congestive heart failure, pericarditis, pneumonia, pulmonary embolism, tension pneumothorax, esophageal rupture, aortic dissection, cardiac tamponade, musculoskeletal   Co morbidities that complicate the patient evaluation  ADHD   Additional history obtained:  Patient presented to ED earlier this month for same complaint   Lab Tests:  I Ordered, and personally interpreted labs.  The pertinent results include:  - Troponin: Within normal limits - BMP: Mild hyponatremia at 134.  CO2 mildly low at 20 which I believe is d/t patient's deep breathing from anxiety - CBC: No concern for anemia or leukocytosis; PLT are a little elevated which seems to be baseline for patient    Imaging Studies ordered:  I ordered imaging studies including  -chest xray: to assess for process contributing to patient's symptoms I independently visualized and interpreted imaging  I agree with the radiologist interpretation   Cardiac Monitoring: / EKG:  The patient was maintained on a cardiac monitor.  I personally viewed and interpreted the cardiac monitored which showed an underlying rhythm of: No change since  last EKG   Risk Stratification Score:  - HEART Score: 0 - Wells Score: 0   Problem List / ED Course / Critical interventions / Medication management  Patient presents to ED concerned for intermittent tunnel vision and palpitations that have been happening after seeing a bad accident involving a death a couple weeks ago.  Patient stating that he has been having panic attacks since this accident, but wanted come to the emergency room for lab work and reassurance.  Physical exam unremarkable.  Patient afebrile with stable vitals.  Denying any infectious symptoms at this time. CBC without leukocytosis or anemia.  Patient's platelets are a slightly elevated which seems to be baseline for him.  Troponin within normal limits.  Heart score 0.  Wells PE score 0.  BMP with mild hyponatremia at 134.  Chest x-ray without acute cardiopulmonary disease.  EKG reassuring. Provided patient with 0.5 Ativan.  Patient stating that he feels better and is ready go home.  Patient endorsing follow-up appoint with his PCP in 2 days where he will begin taking Zoloft.  Educated patient on the benefit of counseling along with medical management.  Patient endorsed understanding of plan.  Patients labs and physical exam is very reassuring today and it seems that his symptoms are from anxiety. However, I did stress to patient that he can always come back to the ED if he ever feels like his health is in danger. Patient verbally endorsed understanding. I have reviewed the patients home medicines and have made adjustments as needed Patient was given return precautions. Patient stable for discharge at this time.  Patient verbalized understanding of plan.  Ddx:  These are considered less likely due to history of present illness and physical exam findings.  -Acute coronary syndrome: EKG and troponins within normal limits  -Congestive heart failure: patient denies orthopnea, cough, and leg edema -Pericarditis: pain is not positional  and patient denies orthopnea and recent illness -Pneumonia: lungs are clear to auscultation bilaterally -Pulmonary embolism: no recent surgeries, blood clot hx, hemoptysis, cancer hx, vitals stable -Pneumothorax: lungs are clear to auscultation bilaterally -Esophageal rupture: patient denies vomiting, heavy drinking, and hx of  GERD -Aortic dissection: vital signs are stable, no variation in pulse pressure -Cardiac tamponade: absence of hypotension, JVD, and muffled heart sounds   Social Determinants of Health:  none           Final Clinical Impression(s) / ED Diagnoses Final diagnoses:  Palpitations    Rx / DC Orders ED Discharge Orders     None         Margarita Rana 06/07/23 1215    Laurence Spates, MD 06/09/23 [phone]

## 2023-06-28 ENCOUNTER — Emergency Department (HOSPITAL_COMMUNITY): Payer: BC Managed Care – PPO

## 2023-06-28 ENCOUNTER — Encounter (HOSPITAL_COMMUNITY): Payer: Self-pay

## 2023-06-28 ENCOUNTER — Other Ambulatory Visit: Payer: Self-pay

## 2023-06-28 ENCOUNTER — Emergency Department (HOSPITAL_COMMUNITY)
Admission: EM | Admit: 2023-06-28 | Discharge: 2023-06-29 | Disposition: A | Discharge status: Home / Self Care | Payer: BC Managed Care – PPO | Attending: Emergency Medicine | Admitting: Emergency Medicine

## 2023-06-28 DIAGNOSIS — R531 Weakness: Secondary | ICD-10-CM | POA: Insufficient documentation

## 2023-06-28 DIAGNOSIS — R519 Headache, unspecified: Secondary | ICD-10-CM | POA: Insufficient documentation

## 2023-06-28 DIAGNOSIS — R200 Anesthesia of skin: Secondary | ICD-10-CM | POA: Insufficient documentation

## 2023-06-28 LAB — CBC WITH DIFFERENTIAL/PLATELET
Abs Immature Granulocytes: 0.02 10*3/uL (ref 0.00–0.07)
Basophils Absolute: 0 10*3/uL (ref 0.0–0.1)
Basophils Relative: 0 %
Eosinophils Absolute: 0.1 10*3/uL (ref 0.0–0.5)
Eosinophils Relative: 1 %
HCT: 44.2 % (ref 39.0–52.0)
Hemoglobin: 15.4 g/dL (ref 13.0–17.0)
Immature Granulocytes: 0 %
Lymphocytes Relative: 30 %
Lymphs Abs: 1.8 10*3/uL (ref 0.7–4.0)
MCH: 29.8 pg (ref 26.0–34.0)
MCHC: 34.8 g/dL (ref 30.0–36.0)
MCV: 85.7 fL (ref 80.0–100.0)
Monocytes Absolute: 0.7 10*3/uL (ref 0.1–1.0)
Monocytes Relative: 11 %
Neutro Abs: 3.6 10*3/uL (ref 1.7–7.7)
Neutrophils Relative %: 58 %
Platelets: 377 10*3/uL (ref 150–400)
RBC: 5.16 MIL/uL (ref 4.22–5.81)
RDW: 12.3 % (ref 11.5–15.5)
WBC: 6.2 10*3/uL (ref 4.0–10.5)
nRBC: 0 % (ref 0.0–0.2)

## 2023-06-28 LAB — COMPREHENSIVE METABOLIC PANEL
ALT: 80 U/L — ABNORMAL HIGH (ref 0–44)
AST: 33 U/L (ref 15–41)
Albumin: 4.4 g/dL (ref 3.5–5.0)
Alkaline Phosphatase: 88 U/L (ref 38–126)
Anion gap: 10 (ref 5–15)
BUN: 17 mg/dL (ref 6–20)
CO2: 21 mmol/L — ABNORMAL LOW (ref 22–32)
Calcium: 8.8 mg/dL — ABNORMAL LOW (ref 8.9–10.3)
Chloride: 104 mmol/L (ref 98–111)
Creatinine, Ser: 0.9 mg/dL (ref 0.61–1.24)
GFR, Estimated: >60 mL/min (ref >60)
Glucose, Bld: 114 mg/dL — ABNORMAL HIGH (ref 70–99)
Potassium: 3.5 mmol/L (ref 3.5–5.1)
Sodium: 135 mmol/L (ref 135–145)
Total Bilirubin: 0.6 mg/dL (ref 0.3–1.2)
Total Protein: 7.1 g/dL (ref 6.5–8.1)

## 2023-06-28 LAB — ETHANOL: Alcohol, Ethyl (B): <10 mg/dL (ref <10)

## 2023-06-28 LAB — CBG MONITORING, ED: Glucose-Capillary: 88 mg/dL (ref 70–99)

## 2023-06-28 MED ORDER — IOHEXOL 350 MG/ML SOLN
75.0000 mL | Freq: Once | INTRAVENOUS | Status: AC | PRN
  Administered 2023-06-28: 75 mL via INTRAVENOUS

## 2023-06-28 MED ORDER — LORAZEPAM 1 MG PO TABS
1.0000 mg | ORAL_TABLET | Freq: Once | ORAL | Status: AC
  Administered 2023-06-28: 1 mg via ORAL
  Filled 2023-06-28: qty 1

## 2023-06-28 MED ORDER — METOCLOPRAMIDE HCL 5 MG/ML IJ SOLN
10.0000 mg | INTRAMUSCULAR | Status: AC
  Administered 2023-06-28: 10 mg via INTRAVENOUS
  Filled 2023-06-28: qty 2

## 2023-06-28 MED ORDER — LORAZEPAM 0.5 MG PO TABS
0.5000 mg | ORAL_TABLET | ORAL | Status: AC
  Administered 2023-06-29: 0.5 mg via ORAL
  Filled 2023-06-28: qty 1

## 2023-06-28 MED ORDER — DIPHENHYDRAMINE HCL 50 MG/ML IJ SOLN
25.0000 mg | INTRAMUSCULAR | Status: AC
  Administered 2023-06-28: 25 mg via INTRAVENOUS
  Filled 2023-06-28: qty 1

## 2023-06-28 NOTE — ED Notes (Signed)
   06/28/23 2200  NIH Stroke Scale  1A. Level of Consciousness 0  1B. Ask Month and Age 22  1C. Blink Eyes & Squeeze Hands 0  2. Best Gaze 0  3. Visual 0  4. Facial Palsy 0  5A. Motor - Left Arm 0  5B. Motor - Right Arm 0  6A. Motor - Left Leg 0  6B. Motor - Right Leg 0  7. Limb Ataxia 0  8. Sensory Loss 1  9. Best Language 0  10. Dysarthria 0  11. Extinction and Inattention 0  NIH Stroke Scale 1

## 2023-06-28 NOTE — Consult Note (Signed)
[facility] Tele[facility]urology Consult Services  Stat Consult  Patient Name:   Todd Lyons, Todd Lyons Date of Birth:   2001-04-19 Identification Number:   MRN - [id number] Date of Service:   06/28/2023 23:05:07  Diagnosis:       R20.2 - Paresthesia of skin  Impression 22 y/o M, history of anxiety, PTSD, ADHD, seizures, presents to hospital with abrupt onset left-sided numbness which started at 9:00 PM. His examination is normal, although still reports that overall, his left side still feels a bit abnormal. NCHCT did not show acute abnormalities. Thrombolytic was not advised given absence of severe, disabling deficits that would suggest severe stroke. CTA head/neck appears normal.  Low clinical suspicion that this is serious neurological issue such as stroke, demyelinating lesion or recurrent seizure. Nonetheless, since he has never had this happen before, would advise MRI brain w/wo contrast to fully rule out possibility of acute intracranial process. If normal, then highest clinical suspicion is for possibility of focal sensory seizure, or psychogenic symptoms. Can also consider migraine with sensory symptoms, although denies history of migraines and for me, does not report having any headaches at this time. If MRI brain is normal, then I feel he could be discharged, with close outpatient neurology follow-up upon discharge. If MRI shows entity such as stroke or demyelinating disease, then would suggest admission for further neurological evaluation accordingly.   Recommendations: Our recommendations are outlined below.  Diagnostic Studies : MRI head with and without contrast Please order  Dispositions : Neurology will followOutpatient Neurology follow up in 1-3 weeks   ----------------------------------------------------------------------------------------------------   Advanced Imaging: CTA Head and Neck Completed.  LVO:No  Patient in not a candidate for  NIR    Metrics: [facility] Notification Time: 06/28/2023 23:02:44 Stamp Time: 06/28/2023 23:05:07 Callback Response Time: 06/28/2023 23:05:19  Primary Provider Notified of Diagnostic Impression and Management Plan on: 06/28/2023 23:47:54   CT HEAD: Reviewed   Imaging CT head w/o contrast: no acute abnormalities CTA head/neck: appears normal  Labs BMP: 135/3.5/104/21/17/0.90/114 Ca 8.8 LFT's: ASG 33, ALT 80, Alk phos 88, TBil 0.6   ----------------------------------------------------------------------------------------------------  Chief Complaint: left-sided numbness  History of Present Illness: Patient is a 22 year old Male. Patient is able to provide history. He reports waking up from a nap around 8:00 PM, felt normal. Then, at 9:00 PM developed abrupt onset of left-sided numbness, including left side of face, arm and leg. Denies any weakness, denies any visual changes, speech/language difficulties or headaches. He feels improved at the time of my evaluation but not back to baseline. Has never had symptoms like this in the past. Reports history of anxiety, recently started on Zoloft. 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    Past Medical History:      Seizures      There is no history of Hypertension      There is no history of Diabetes Mellitus      There is no history of Hyperlipidemia      There is no history of Atrial Fibrillation      There is no history of Stroke Other PMH:  PTSD; anxiety; panic attacks; ADHD  Medications:  No Anticoagulant use  No Antiplatelet use Reviewed EMR for current medications  Allergies:  Reviewed  Social History: Smoking: No Alcohol Use: No Drug Use: No  Family History:  There is no family history of premature cerebrovascular disease pertinent to this consultation  ROS : 14 Points Review  of Systems was performed and was negative except  mentioned in HPI.  Past Surgical History: There Is No Surgical History Contributory To Today's Visit    Examination: BP(131/82), Pulse(121), Blood Glucose(88) 1A: Level of Consciousness - Alert; keenly responsive + 0 1B: Ask Month and Age - Both Questions Right + 0 1C: Blink Eyes & Squeeze Hands - Performs Both Tasks + 0 2: Test Horizontal Extraocular Movements - Normal + 0 3: Test Visual Fields - No Visual Loss + 0 4: Test Facial Palsy (Use Grimace if Obtunded) - Normal symmetry + 0 5A: Test Left Arm Motor Drift - No Drift for 10 Seconds + 0 5B: Test Right Arm Motor Drift - No Drift for 10 Seconds + 0 6A: Test Left Leg Motor Drift - No Drift for 5 Seconds + 0 6B: Test Right Leg Motor Drift - No Drift for 5 Seconds + 0 7: Test Limb Ataxia (FNF/Heel-Shin) - No Ataxia + 0 8: Test Sensation - Normal; No sensory loss + 0 9: Test Language/Aphasia - Normal; No aphasia + 0 10: Test Dysarthria - Normal + 0 11: Test Extinction/Inattention - No abnormality + 0  NIHSS Score: 0  Spoke with : Dr. Eloise Harman    This consult was conducted in real time using interactive audio and Immunologist. Patient was informed of the technology being used for this visit and agreed to proceed. Patient located in hospital and provider located at home/office setting.  Patient is being evaluated for possible acute neurologic impairment and high probability of imminent or life - threatening deterioration.I spent total of 41 minutes providing care to this patient, including time for face to face visit via telemedicine, review of medical records, imaging studies and discussion of findings with providers, the patient and / or family.   Dr Peri Jefferson   [facility] For Inpatient follow-up with [facility] physician please call [facility] [phone]. This is not an outpatient service. Post hospital discharge, please contact hospital directly.  Please do not communicate with [facility] physicians via  secure chat. If you have any questions, Please contact [facility]. Please call or reconsult our service if there are any clinical or diagnostic changes.

## 2023-06-28 NOTE — ED Triage Notes (Signed)
Pt walked into triage with a normal gait  Pt stated he woke up from a nap at 8pm "Normal" when woke up LEFT side getting progressively numb. Pt stated that face, core, arm and leg are numb.   Denies recent trauma  Denies any drugs or ETOH  Hx of Seizures, PTSD, anxiety and panic attacks. Last seizure in 2021. Pt complains of high anxiety and nerves right now. Pt is bouncing in seat and talking fast in triage

## 2023-06-28 NOTE — ED Provider Notes (Signed)
[place] EMERGENCY DEPARTMENT AT [facility] Provider Note   CSN: [id number] Arrival date & time: 06/28/23  2212     History  Chief Complaint  Patient presents with   Numbness    Todd Lyons is a 22 y.o. male.  22 year old male with history of seizures who presents emergency department left-sided numbness.  Patient reports he woke up from a nap at 8 PM tonight.  At 9:15 PM he started experiencing numbness on the left side of his face and left arm and on his left leg.  Said he also had a mild occipital headache at that time but it resolved after about 15 minutes.  No history of similar symptoms.  No weakness, slurred speech, vision changes.  No headache.  No neck pain.  Not on blood thinners.       Home Medications Prior to Admission medications   Medication Sig Start Date End Date Taking? Authorizing Provider  albuterol (PROVENTIL HFA;VENTOLIN HFA) 108 (90 Base) MCG/ACT inhaler Inhale 1-2 puffs into the lungs every 6 (six) hours as needed for wheezing or shortness of breath.  04/29/16   [provider]  atomoxetine (STRATTERA) 80 MG capsule Take 100 mg by mouth at bedtime. 11/04/18   [provider]  cetirizine (ZYRTEC) 10 MG tablet Take 10 mg by mouth daily.    [provider]  hydrOXYzine (ATARAX) 25 MG tablet Take 1 tablet (25 mg total) by mouth every 8 (eight) hours as needed for anxiety. 05/25/23   Eber Hong, MD  LamoTRIgine 200 MG TB24 24 hour tablet Take 2 tablets by mouth at bedtime. 07/03/21   [provider]  montelukast (SINGULAIR) 10 MG tablet Take 10 mg by mouth daily. 09/16/19   [provider]  naproxen (NAPROSYN) 500 MG tablet Take 1 tablet (500 mg total) by mouth 2 (two) times daily. 11/07/22   Burgess Amor, PA-C  NAYZILAM 5 MG/0.1ML SOLN  11/12/19   [provider]  oxyCODONE-acetaminophen (PERCOCET/ROXICET) 5-325 MG tablet Take 1 tablet by mouth every 6 (six) hours as needed for severe pain.  11/13/22   McKenzie, Mardene Celeste, MD  phenylephrine-shark liver oil-mineral oil-petrolatum (PREPARATION H) 0.25-14-74.9 % rectal ointment Place 1 Application rectally 2 (two) times daily as needed for hemorrhoids. 06/04/22   Theadora Rama Scales, PA-C  Psyllium (GOODSENSE PSYLLIUM FIBER) 51.7 % POWD Take 1 capsule by mouth every 12 (twelve) hours. 06/04/22   Theadora Rama Scales, PA-C  sertraline (ZOLOFT) 25 MG tablet Take 25 mg by mouth daily.    [provider]  tamsulosin (FLOMAX) 0.4 MG CAPS capsule Take 1 capsule (0.4 mg total) by mouth daily after supper. 11/13/22   McKenzie, Mardene Celeste, MD  traZODone (DESYREL) 50 MG tablet Take 50 mg by mouth at bedtime. 07/15/21   [provider]  levETIRAcetam 500 MG TB3D Take 1/2 tablet twice daily for 7 days, 1 tablet twice daily for 7 days, then 1-1/2 tablets twice daily 10/01/19 08/03/20  Deetta Perla, MD      Allergies    Morphine    Review of Systems   Review of Systems  Physical Exam Updated Vital Signs BP 128/75   Pulse 71   Temp 98.9 F (37.2 C) (Oral)   Resp 18   Ht 5\' 9"  (1.753 m)   Wt 77.1 kg   SpO2 98%   BMI 25.10 kg/m  Physical Exam Vitals and nursing note reviewed.  Constitutional:      General: He is not in  acute distress.    Appearance: He is well-developed.  HENT:     Head: Normocephalic and atraumatic.     Right Ear: External ear normal.     Left Ear: External ear normal.     Nose: Nose normal.  Eyes:     Extraocular Movements: Extraocular movements intact.     Conjunctiva/sclera: Conjunctivae normal.     Pupils: Pupils are equal, round, and reactive to light.  Cardiovascular:     Rate and Rhythm: Normal rate and regular rhythm.     Heart sounds: Normal heart sounds.  Pulmonary:     Effort: Pulmonary effort is normal. No respiratory distress.     Breath sounds: Normal breath sounds.  Musculoskeletal:     Cervical back: Normal range of motion and neck supple.     Right lower leg: No  edema.     Left lower leg: No edema.  Skin:    General: Skin is warm and dry.  Neurological:     Mental Status: He is alert.     Comments: MENTAL STATUS: AAOx3 CRANIAL NERVES: II: Pupils equal and reactive 5 mm BL, no RAPD, no VF deficits III, IV, VI: EOM intact, no gaze preference or deviation, no nystagmus. V: Diminished sensation to light touch in V1 through V3 segments on the left VII: no facial weakness or asymmetry, no nasolabial fold flattening VIII: normal hearing to speech and finger friction IX, X: normal palatal elevation, no uvular deviation XI: 5/5 head turn and 5/5 shoulder shrug bilaterally XII: midline tongue protrusion MOTOR: 5/5 strength in R shoulder flexion, elbow flexion and extension, and grip strength. 5/5 strength in L shoulder flexion, elbow flexion and extension, and grip strength.  5/5 strength in R hip and knee flexion, knee extension, ankle plantar and dorsiflexion. 5/5 strength in L hip and knee flexion, knee extension, ankle plantar and dorsiflexion. SENSORY: Diminished sensation to light touch in left upper extremity COORD: Normal finger to nose and heel to shin, no tremor, no dysmetria   Psychiatric:        Mood and Affect: Mood normal.        Behavior: Behavior normal.     ED Results / Procedures / Treatments   Labs (all labs ordered are listed, but only abnormal results are displayed) Labs Reviewed  COMPREHENSIVE METABOLIC PANEL - Abnormal; Notable for the following components:      Result Value   CO2 21 (*)    Glucose, Bld 114 (*)    Calcium 8.8 (*)    ALT 80 (*)    All other components within normal limits  CBC WITH DIFFERENTIAL/PLATELET  ETHANOL  URINALYSIS, ROUTINE W REFLEX MICROSCOPIC  RAPID URINE DRUG SCREEN, HOSP PERFORMED  CBG MONITORING, ED  I-STAT CHEM 8, ED    EKG EKG Interpretation Date/Time:  Saturday June 28 2023 23:37:45 EDT Ventricular Rate:  74 PR Interval:  150 QRS Duration:  117 QT Interval:  389 QTC  Calculation: 432 R Axis:   69  Text Interpretation: Sinus rhythm IRBBB and LPFB Confirmed by Vonita Moss [phone]) on 06/28/2023 11:40:39 PM  Radiology CT ANGIO HEAD NECK W WO CM  Result Date: 06/28/2023 CLINICAL DATA:  Code stroke. Initial evaluation for acute left-sided numbness. EXAM: CT HEAD WITHOUT CONTRAST CT ANGIOGRAPHY HEAD AND NECK TECHNIQUE: Multidetector CT imaging of the head and neck was performed using the standard protocol during bolus administration of intravenous contrast. Multiplanar CT image reconstructions and MIPs were obtained to evaluate the vascular anatomy. Carotid stenosis  measurements (when applicable) are obtained utilizing NASCET criteria, using the distal internal carotid diameter as the denominator. RADIATION DOSE REDUCTION: This exam was performed according to the departmental dose-optimization program which includes automated exposure control, adjustment of the mA and/or kV according to patient size and/or use of iterative reconstruction technique. CONTRAST:  75mL OMNIPAQUE IOHEXOL 350 MG/ML SOLN COMPARISON:  Prior study from 08/10/2019 FINDINGS: CT HEAD FINDINGS Brain: Cerebral volume within normal limits for patient age. No evidence for acute intracranial hemorrhage. No findings to suggest acute large vessel territory infarct. No mass lesion, midline shift, or mass effect. Ventricles are normal in size without evidence for hydrocephalus. No extra-axial fluid collection identified. Vascular: No hyperdense vessel identified. Skull: Scalp soft tissues demonstrate no acute abnormality. Calvarium intact. Sinuses/Orbits: Globes and orbital soft tissues within normal limits. Visualized paranasal sinuses are clear. No mastoid effusion. ASPECTS [facility] Stroke Program Early CT Score) - Ganglionic level infarction (caudate, lentiform nuclei, internal capsule, insula, M1-M3 cortex): 7 - Supraganglionic infarction (M4-M6 cortex): 3 Total score (0-10 with 10 being normal): 10 CTA  NECK FINDINGS Aortic arch: Standard branching. Imaged portion shows no evidence of aneurysm or dissection. No significant stenosis of the major arch vessel origins. Right carotid system: No evidence of dissection, stenosis (50% or greater), or occlusion. Left carotid system: No evidence of dissection, stenosis (50% or greater), or occlusion. Vertebral arteries: No evidence of dissection, stenosis (50% or greater), or occlusion. Skeleton: No discrete or worrisome osseous lesions. Other neck: No other acute finding. Upper chest: No other acute finding. Review of the MIP images confirms the above findings CTA HEAD FINDINGS Anterior circulation: Both internal carotid arteries are patent to the termini without stenosis or other abnormality. A1 segments, anterior communicating artery complex common anterior cerebral arteries patent without stenosis. No M1 stenosis or occlusion. Distal MCA branches perfused and symmetric. Posterior circulation: Both V4 segments patent without stenosis. Both PICA patent. Basilar patent without stenosis. Superior cerebral arteries patent bilaterally. Both PCAs patent and well perfused to their distal aspects. Venous sinuses: Patent allowing for timing the contrast bolus. Anatomic variants: None significant.  No aneurysm. Review of the MIP images confirms the above findings IMPRESSION: 1. Normal head CT. No acute intracranial abnormality. 2. Aspects equals 10. 3. Normal CTA of the head and neck. No large vessel occlusion, hemodynamically significant stenosis, or other acute vascular abnormality. Results were called by telephone at the time of interpretation on 06/28/2023 at 11:46 pm to provider Reshad Saab. Electronically Signed   By: Rise Mu M.D.   On: 06/28/2023 23:53   CT HEAD CODE STROKE WO CONTRAST  Result Date: 06/28/2023 CLINICAL DATA:  Code stroke. Initial evaluation for acute left-sided numbness. EXAM: CT HEAD WITHOUT CONTRAST CT ANGIOGRAPHY HEAD AND NECK  TECHNIQUE: Multidetector CT imaging of the head and neck was performed using the standard protocol during bolus administration of intravenous contrast. Multiplanar CT image reconstructions and MIPs were obtained to evaluate the vascular anatomy. Carotid stenosis measurements (when applicable) are obtained utilizing NASCET criteria, using the distal internal carotid diameter as the denominator. RADIATION DOSE REDUCTION: This exam was performed according to the departmental dose-optimization program which includes automated exposure control, adjustment of the mA and/or kV according to patient size and/or use of iterative reconstruction technique. CONTRAST:  75mL OMNIPAQUE IOHEXOL 350 MG/ML SOLN COMPARISON:  Prior study from 08/10/2019 FINDINGS: CT HEAD FINDINGS Brain: Cerebral volume within normal limits for patient age. No evidence for acute intracranial hemorrhage. No findings to suggest acute large vessel territory infarct.  No mass lesion, midline shift, or mass effect. Ventricles are normal in size without evidence for hydrocephalus. No extra-axial fluid collection identified. Vascular: No hyperdense vessel identified. Skull: Scalp soft tissues demonstrate no acute abnormality. Calvarium intact. Sinuses/Orbits: Globes and orbital soft tissues within normal limits. Visualized paranasal sinuses are clear. No mastoid effusion. ASPECTS [facility] Stroke Program Early CT Score) - Ganglionic level infarction (caudate, lentiform nuclei, internal capsule, insula, M1-M3 cortex): 7 - Supraganglionic infarction (M4-M6 cortex): 3 Total score (0-10 with 10 being normal): 10 CTA NECK FINDINGS Aortic arch: Standard branching. Imaged portion shows no evidence of aneurysm or dissection. No significant stenosis of the major arch vessel origins. Right carotid system: No evidence of dissection, stenosis (50% or greater), or occlusion. Left carotid system: No evidence of dissection, stenosis (50% or greater), or occlusion. Vertebral  arteries: No evidence of dissection, stenosis (50% or greater), or occlusion. Skeleton: No discrete or worrisome osseous lesions. Other neck: No other acute finding. Upper chest: No other acute finding. Review of the MIP images confirms the above findings CTA HEAD FINDINGS Anterior circulation: Both internal carotid arteries are patent to the termini without stenosis or other abnormality. A1 segments, anterior communicating artery complex common anterior cerebral arteries patent without stenosis. No M1 stenosis or occlusion. Distal MCA branches perfused and symmetric. Posterior circulation: Both V4 segments patent without stenosis. Both PICA patent. Basilar patent without stenosis. Superior cerebral arteries patent bilaterally. Both PCAs patent and well perfused to their distal aspects. Venous sinuses: Patent allowing for timing the contrast bolus. Anatomic variants: None significant.  No aneurysm. Review of the MIP images confirms the above findings IMPRESSION: 1. Normal head CT. No acute intracranial abnormality. 2. Aspects equals 10. 3. Normal CTA of the head and neck. No large vessel occlusion, hemodynamically significant stenosis, or other acute vascular abnormality. Results were called by telephone at the time of interpretation on 06/28/2023 at 11:46 pm to provider Neville Pauls. Electronically Signed   By: Rise Mu M.D.   On: 06/28/2023 23:53    Procedures Procedures    Medications Ordered in ED Medications  LORazepam (ATIVAN) tablet 0.5 mg (has no administration in time range)  metoCLOPramide (REGLAN) injection 10 mg (10 mg Intravenous Given 06/28/23 2309)  diphenhydrAMINE (BENADRYL) injection 25 mg (25 mg Intravenous Given 06/28/23 2310)  iohexol (OMNIPAQUE) 350 MG/ML injection 75 mL (75 mLs Intravenous Contrast Given 06/28/23 2333)  LORazepam (ATIVAN) tablet 1 mg (1 mg Oral Given 06/28/23 2344)    ED Course/ Medical Decision Making/ A&P Clinical Course as of 06/28/23 2358   Sat Jun 28, 2023  2249 Stroke neurologist paged [RP]  2346 Dr Graciela Husbands from neurology has called. Recommends mri wwo contrast.  [RP]    Clinical Course User Index [RP] Rondel Baton, MD                    NIH Stroke Scale: 1              Medical Decision Making Amount and/or Complexity of Data Reviewed Labs: ordered. Radiology: ordered.  Risk Prescription drug management.   Todd Lyons is a 22 y.o. male with comorbidities that complicate the patient evaluation including seizures and anxiety presents emergency department with left-sided numbness  Initial Ddx:  Complex migraine, seizure, stroke, ICH, dissection  MDM/Course:  Patient presents emergency department with left-sided numbness.  No other focal neurologic deficits.  Initially suspected either complex migraine or potentially anxiety.  Does not have any risk factors for stroke and has  no other deficits that would likely warrant TNK.  Attempted to contact teleneurology discussed the case with them but due to delays activated code stroke.  CT head and CTA of the head and neck without acute abnormality.  Neurology felt that his symptoms were likely due to anxiety but did recommend an MRI with and without contrast out of an abundance of caution to evaluate for possible stroke or demyelinating lesion. Signed out to Dr Blinda Leatherwood awaiting labs and re-evaluation.   This patient presents to the ED for concern of complaints listed in HPI, this involves an extensive number of treatment options, and is a complaint that carries with it a high risk of complications and morbidity. Disposition including potential need for admission considered.   Dispo: Pending remainder of workup  Records reviewed Outpatient Clinic Notes The following labs were independently interpreted: CBC and show no acute abnormality I independently reviewed the following imaging with scope of interpretation limited to determining acute life threatening conditions  related to emergency care: CT Head and agree with the radiologist interpretation with the following exceptions: none I personally reviewed and interpreted cardiac monitoring: normal sinus rhythm  I personally reviewed and interpreted the pt's EKG: see above for interpretation  I have reviewed the patients home medications and made adjustments as needed Consults: Neurology   Portions of this note were generated with Dragon dictation software. Dictation errors may occur despite best attempts at proofreading.           Final Clinical Impression(s) / ED Diagnoses Final diagnoses:  Nonintractable headache, unspecified chronicity pattern, unspecified headache type  Left-sided weakness    Rx / DC Orders ED Discharge Orders     None         Rondel Baton, MD 06/28/23 2358

## 2023-06-28 NOTE — Consult Note (Incomplete)
Code Stroke 2310: Code stroke cart activated at this time.  Pt with Left side numbness after a nap. Benadryl and Reglan at time of stroke activation. Pt recently  Running code stroke per EDP at this time. Requested that staff pre-pull TNK at this time: NO  2315: Patient left for CT at this time.  ***: TSP paged at this time.  2333: Patient returned from CT at this time.  2318: Dr. Graciela Husbands on camera at this time. Patient report and history provided to Dr. Graciela Husbands at this time.  2333: Dr. Graciela Husbands performing neuro assessment at this time.  ***: CTA/CTP imaging results provided to Dr. Graciela Husbands via private messaging at this time.   MRS X, NIH X,

## 2023-06-28 NOTE — ED Notes (Signed)
CODE STROKE ACTIVATED PER DR Eloise Harman

## 2023-06-28 NOTE — ED Notes (Signed)
Pt back from CT speaking with neurologist

## 2023-06-28 NOTE — ED Notes (Signed)
STAT CONSULT  PAGED TO DR Eloise Harman

## 2023-06-28 NOTE — Consult Note (Signed)
Code Stroke 2310: Code stroke cart activated at this time.  Pt with Left side numbness after a nap. Benadryl and Reglan at time of stroke activation. Pt recently  Running code stroke per EDP at this time. Requested that staff pre-pull TNK at this time: NO  2315: Patient left for CT at this time.  2316: TSP paged at this time.  2333: Patient returned from CT at this time.  2318: Dr. Graciela Husbands on camera at this time. Patient report and history provided to Dr. Graciela Husbands at this time.  2333: Dr. Graciela Husbands performing neuro assessment at this time.  2357: CTA/CTP imaging results provided to Dr. Graciela Husbands via private messaging at this time. 2357Graciela Husbands, MD acknowledged   MRS 0, NIH 0,   2317 While in CT, the TSRN recieved a call from TSP. Bedside had paged out a stat consult for this patient prior to code stroke cart activation. Explained to operator that I was unaware of inital page and this should be a code stroke. Two providers appeard on screen, then one logged off. Case continued as a code stroke with [facility] managing.

## 2023-06-28 NOTE — ED Notes (Signed)
Informed PA of findings

## 2023-06-28 NOTE — ED Notes (Signed)
Pt in room with parents at bedside. Pt expressed still be anxious. EDP provider was notified and medication was administered.

## 2023-06-28 NOTE — ED Notes (Signed)
Neurology paged to Dr Eloise Harman @ [phone]
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
## 2023-06-29 ENCOUNTER — Emergency Department (HOSPITAL_COMMUNITY): Payer: BC Managed Care – PPO

## 2023-06-29 ENCOUNTER — Emergency Department (HOSPITAL_COMMUNITY)
Admission: EM | Admit: 2023-06-29 | Discharge: 2023-06-29 | Disposition: A | Discharge status: Home / Self Care | Payer: BC Managed Care – PPO | Attending: Emergency Medicine | Admitting: Emergency Medicine

## 2023-06-29 ENCOUNTER — Encounter (HOSPITAL_COMMUNITY): Payer: Self-pay

## 2023-06-29 DIAGNOSIS — R200 Anesthesia of skin: Secondary | ICD-10-CM | POA: Insufficient documentation

## 2023-06-29 LAB — CBC WITH DIFFERENTIAL/PLATELET
Abs Immature Granulocytes: 0.02 10*3/uL (ref 0.00–0.07)
Basophils Absolute: 0 10*3/uL (ref 0.0–0.1)
Basophils Relative: 0 %
Eosinophils Absolute: 0 10*3/uL (ref 0.0–0.5)
Eosinophils Relative: 0 %
HCT: 47.5 % (ref 39.0–52.0)
Hemoglobin: 15.9 g/dL (ref 13.0–17.0)
Immature Granulocytes: 0 %
Lymphocytes Relative: 16 %
Lymphs Abs: 1.3 10*3/uL (ref 0.7–4.0)
MCH: 28.8 pg (ref 26.0–34.0)
MCHC: 33.5 g/dL (ref 30.0–36.0)
MCV: 86.1 fL (ref 80.0–100.0)
Monocytes Absolute: 0.7 10*3/uL (ref 0.1–1.0)
Monocytes Relative: 8 %
Neutro Abs: 6.1 10*3/uL (ref 1.7–7.7)
Neutrophils Relative %: 76 %
Platelets: 384 10*3/uL (ref 150–400)
RBC: 5.52 MIL/uL (ref 4.22–5.81)
RDW: 12.1 % (ref 11.5–15.5)
WBC: 8.1 10*3/uL (ref 4.0–10.5)
nRBC: 0 % (ref 0.0–0.2)

## 2023-06-29 LAB — I-STAT CHEM 8, ED
BUN: 18 mg/dL (ref 6–20)
Calcium, Ion: 1.21 mmol/L (ref 1.15–1.40)
Chloride: 103 mmol/L (ref 98–111)
Creatinine, Ser: 1 mg/dL (ref 0.61–1.24)
Glucose, Bld: 88 mg/dL (ref 70–99)
HCT: 48 % (ref 39.0–52.0)
Hemoglobin: 16.3 g/dL (ref 13.0–17.0)
Potassium: 4.2 mmol/L (ref 3.5–5.1)
Sodium: 139 mmol/L (ref 135–145)
TCO2: 23 mmol/L (ref 22–32)

## 2023-06-29 LAB — BASIC METABOLIC PANEL
Anion gap: 11 (ref 5–15)
BUN: 16 mg/dL (ref 6–20)
CO2: 23 mmol/L (ref 22–32)
Calcium: 9.6 mg/dL (ref 8.9–10.3)
Chloride: 102 mmol/L (ref 98–111)
Creatinine, Ser: 0.97 mg/dL (ref 0.61–1.24)
GFR, Estimated: >60 mL/min (ref >60)
Glucose, Bld: 90 mg/dL (ref 70–99)
Potassium: 4.2 mmol/L (ref 3.5–5.1)
Sodium: 136 mmol/L (ref 135–145)

## 2023-06-29 MED ORDER — LORAZEPAM 2 MG/ML IJ SOLN: 1.0000 mg | Freq: Once | INTRAMUSCULAR | Status: DC | PRN

## 2023-06-29 MED ORDER — GADOBUTROL 1 MMOL/ML IV SOLN
8.0000 mL | Freq: Once | INTRAVENOUS | Status: AC | PRN
  Administered 2023-06-29: 8 mL via INTRAVENOUS

## 2023-06-29 MED ORDER — DIAZEPAM 5 MG/ML IJ SOLN
5.0000 mg | Freq: Once | INTRAMUSCULAR | Status: AC | PRN
  Administered 2023-06-29: 5 mg via INTRAVENOUS
  Filled 2023-06-29: qty 2

## 2023-06-29 NOTE — ED Notes (Signed)
EDP in room

## 2023-06-29 NOTE — Discharge Instructions (Signed)
As we discussed, it is not clear what is causing the change in sensation on the left side of your body.  It could be something minor like migraine headache but we do need to evaluate for stroke, tumor, multiple sclerosis.  The only way to look for these things is the MRI we talked about.  You indicated that he will follow-up tomorrow for the MRI.  You can come here at 7 AM and we can arrange for this.  If it is later in the day, you will need to go to [facility] or [facility] to have the MRI performed.  If you do not come back tomorrow, please call your neurologist Monday morning to schedule prompt follow-up.  Return to the ER for any recurrent or worsening symptoms.

## 2023-06-29 NOTE — Discharge Instructions (Signed)
As we discussed your MRI is unremarkable today.  You do not have any stroke or multiple sclerosis.  You can follow-up with your doctor  Return to ER if you have worse numbness or weakness or trouble speaking

## 2023-06-29 NOTE — ED Triage Notes (Signed)
Pt arrived POV from home c/o left sided numbness from yesterday and was seen at [facility]. Pt was told to come back today for a MRI. Pt states the left sided numbness has resolved at this time, but was following drs orders.

## 2023-06-29 NOTE — ED Provider Notes (Signed)
[place] EMERGENCY DEPARTMENT AT [facility] Provider Note   CSN: [id number] Arrival date & time: 06/29/23  1629     History  Chief Complaint  Patient presents with   Numbness    Pt needs MRI sent from [facility] Todd Lyons is a 22 y.o. male history of PTSD, here presenting with left-sided numbness.  Patient went to [facility] yesterday.  He had some numbness in the left face and left arm.  Neurology was consulted and recommended MRI with and without contrast.  Patient ended up leaving the hospital and coming here for further evaluation.  Patient denies any further episodes of numbness.  He thinks it is likely his PTSD.  The history is provided by the patient.       Home Medications Prior to Admission medications   Medication Sig Start Date End Date Taking? Authorizing Provider  albuterol (PROVENTIL HFA;VENTOLIN HFA) 108 (90 Base) MCG/ACT inhaler Inhale 1-2 puffs into the lungs every 6 (six) hours as needed for wheezing or shortness of breath.  04/29/16   [provider]  atomoxetine (STRATTERA) 80 MG capsule Take 100 mg by mouth at bedtime. 11/04/18   [provider]  cetirizine (ZYRTEC) 10 MG tablet Take 10 mg by mouth daily.    [provider]  hydrOXYzine (ATARAX) 25 MG tablet Take 1 tablet (25 mg total) by mouth every 8 (eight) hours as needed for anxiety. 05/25/23   Eber Hong, MD  LamoTRIgine 200 MG TB24 24 hour tablet Take 2 tablets by mouth at bedtime. 07/03/21   [provider]  montelukast (SINGULAIR) 10 MG tablet Take 10 mg by mouth daily. 09/16/19   [provider]  naproxen (NAPROSYN) 500 MG tablet Take 1 tablet (500 mg total) by mouth 2 (two) times daily. 11/07/22   Burgess Amor, PA-C  NAYZILAM 5 MG/0.1ML SOLN  11/12/19   [provider]  oxyCODONE-acetaminophen (PERCOCET/ROXICET) 5-325 MG tablet Take 1 tablet by mouth every 6 (six) hours as needed for severe pain. 11/13/22   McKenzie,  Mardene Celeste, MD  phenylephrine-shark liver oil-mineral oil-petrolatum (PREPARATION H) 0.25-14-74.9 % rectal ointment Place 1 Application rectally 2 (two) times daily as needed for hemorrhoids. 06/04/22   Theadora Rama Scales, PA-C  Psyllium (GOODSENSE PSYLLIUM FIBER) 51.7 % POWD Take 1 capsule by mouth every 12 (twelve) hours. 06/04/22   Theadora Rama Scales, PA-C  sertraline (ZOLOFT) 25 MG tablet Take 25 mg by mouth daily.    [provider]  tamsulosin (FLOMAX) 0.4 MG CAPS capsule Take 1 capsule (0.4 mg total) by mouth daily after supper. 11/13/22   McKenzie, Mardene Celeste, MD  traZODone (DESYREL) 50 MG tablet Take 50 mg by mouth at bedtime. 07/15/21   [provider]  levETIRAcetam 500 MG TB3D Take 1/2 tablet twice daily for 7 days, 1 tablet twice daily for 7 days, then 1-1/2 tablets twice daily 10/01/19 08/03/20  Deetta Perla, MD      Allergies    Morphine    Review of Systems   Review of Systems  Neurological:  Positive for numbness.  All other systems reviewed and are negative.   Physical Exam Updated Vital Signs BP 127/76 (BP Location: Right Arm)   Pulse 75   Temp 98.2 F (36.8 C) (Oral)   Resp 18   Ht 5\' 9"  (1.753 m)   Wt 77.1 kg   SpO2 98%   BMI 25.10 kg/m  Physical Exam Vitals and nursing note reviewed.  Constitutional:      Appearance: Normal appearance.  HENT:     Head: Normocephalic.     Nose: Nose normal.     Mouth/Throat:     Mouth: Mucous membranes are moist.  Eyes:     Extraocular Movements: Extraocular movements intact.     Pupils: Pupils are equal, round, and reactive to light.  Cardiovascular:     Rate and Rhythm: Normal rate and regular rhythm.     Pulses: Normal pulses.     Heart sounds: Normal heart sounds.  Pulmonary:     Effort: Pulmonary effort is normal.     Breath sounds: Normal breath sounds.  Abdominal:     Palpations: Abdomen is soft.  Musculoskeletal:        General: Normal range of motion.     Cervical back:  Normal range of motion and neck supple.  Skin:    General: Skin is warm.     Capillary Refill: Capillary refill takes less than 2 seconds.  Neurological:     Mental Status: He is alert.     Comments: Normal sensation bilateral arms and legs and face.  Psychiatric:        Mood and Affect: Mood normal.        Behavior: Behavior normal.     ED Results / Procedures / Treatments   Labs (all labs ordered are listed, but only abnormal results are displayed) Labs Reviewed  CBC WITH DIFFERENTIAL/PLATELET  BASIC METABOLIC PANEL  I-STAT CHEM 8, ED    EKG None  Radiology CT ANGIO HEAD NECK W WO CM  Result Date: 06/28/2023 CLINICAL DATA:  Code stroke. Initial evaluation for acute left-sided numbness. EXAM: CT HEAD WITHOUT CONTRAST CT ANGIOGRAPHY HEAD AND NECK TECHNIQUE: Multidetector CT imaging of the head and neck was performed using the standard protocol during bolus administration of intravenous contrast. Multiplanar CT image reconstructions and MIPs were obtained to evaluate the vascular anatomy. Carotid stenosis measurements (when applicable) are obtained utilizing NASCET criteria, using the distal internal carotid diameter as the denominator. RADIATION DOSE REDUCTION: This exam was performed according to the departmental dose-optimization program which includes automated exposure control, adjustment of the mA and/or kV according to patient size and/or use of iterative reconstruction technique. CONTRAST:  75mL OMNIPAQUE IOHEXOL 350 MG/ML SOLN COMPARISON:  Prior study from 08/10/2019 FINDINGS: CT HEAD FINDINGS Brain: Cerebral volume within normal limits for patient age. No evidence for acute intracranial hemorrhage. No findings to suggest acute large vessel territory infarct. No mass lesion, midline shift, or mass effect. Ventricles are normal in size without evidence for hydrocephalus. No extra-axial fluid collection identified. Vascular: No hyperdense vessel identified. Skull: Scalp soft  tissues demonstrate no acute abnormality. Calvarium intact. Sinuses/Orbits: Globes and orbital soft tissues within normal limits. Visualized paranasal sinuses are clear. No mastoid effusion. ASPECTS [facility] Stroke Program Early CT Score) - Ganglionic level infarction (caudate, lentiform nuclei, internal capsule, insula, M1-M3 cortex): 7 - Supraganglionic infarction (M4-M6 cortex): 3 Total score (0-10 with 10 being normal): 10 CTA NECK FINDINGS Aortic arch: Standard branching. Imaged portion shows no evidence of aneurysm or dissection. No significant stenosis of the major arch vessel origins. Right carotid system: No evidence of dissection, stenosis (50% or greater), or occlusion. Left carotid system: No evidence of dissection, stenosis (50% or greater), or occlusion. Vertebral arteries: No evidence of dissection, stenosis (50% or greater), or occlusion. Skeleton: No discrete or worrisome osseous lesions. Other neck: No other acute finding. Upper chest: No other acute finding. Review of  the MIP images confirms the above findings CTA HEAD FINDINGS Anterior circulation: Both internal carotid arteries are patent to the termini without stenosis or other abnormality. A1 segments, anterior communicating artery complex common anterior cerebral arteries patent without stenosis. No M1 stenosis or occlusion. Distal MCA branches perfused and symmetric. Posterior circulation: Both V4 segments patent without stenosis. Both PICA patent. Basilar patent without stenosis. Superior cerebral arteries patent bilaterally. Both PCAs patent and well perfused to their distal aspects. Venous sinuses: Patent allowing for timing the contrast bolus. Anatomic variants: None significant.  No aneurysm. Review of the MIP images confirms the above findings IMPRESSION: 1. Normal head CT. No acute intracranial abnormality. 2. Aspects equals 10. 3. Normal CTA of the head and neck. No large vessel occlusion, hemodynamically significant stenosis, or  other acute vascular abnormality. Results were called by telephone at the time of interpretation on 06/28/2023 at 11:46 pm to provider ROBERT PATERSON. Electronically Signed   By: Rise Mu M.D.   On: 06/28/2023 23:53   CT HEAD CODE STROKE WO CONTRAST  Result Date: 06/28/2023 CLINICAL DATA:  Code stroke. Initial evaluation for acute left-sided numbness. EXAM: CT HEAD WITHOUT CONTRAST CT ANGIOGRAPHY HEAD AND NECK TECHNIQUE: Multidetector CT imaging of the head and neck was performed using the standard protocol during bolus administration of intravenous contrast. Multiplanar CT image reconstructions and MIPs were obtained to evaluate the vascular anatomy. Carotid stenosis measurements (when applicable) are obtained utilizing NASCET criteria, using the distal internal carotid diameter as the denominator. RADIATION DOSE REDUCTION: This exam was performed according to the departmental dose-optimization program which includes automated exposure control, adjustment of the mA and/or kV according to patient size and/or use of iterative reconstruction technique. CONTRAST:  75mL OMNIPAQUE IOHEXOL 350 MG/ML SOLN COMPARISON:  Prior study from 08/10/2019 FINDINGS: CT HEAD FINDINGS Brain: Cerebral volume within normal limits for patient age. No evidence for acute intracranial hemorrhage. No findings to suggest acute large vessel territory infarct. No mass lesion, midline shift, or mass effect. Ventricles are normal in size without evidence for hydrocephalus. No extra-axial fluid collection identified. Vascular: No hyperdense vessel identified. Skull: Scalp soft tissues demonstrate no acute abnormality. Calvarium intact. Sinuses/Orbits: Globes and orbital soft tissues within normal limits. Visualized paranasal sinuses are clear. No mastoid effusion. ASPECTS [place] Stroke Program Early CT Score) - Ganglionic level infarction (caudate, lentiform nuclei, internal capsule, insula, M1-M3 cortex): 7 - Supraganglionic  infarction (M4-M6 cortex): 3 Total score (0-10 with 10 being normal): 10 CTA NECK FINDINGS Aortic arch: Standard branching. Imaged portion shows no evidence of aneurysm or dissection. No significant stenosis of the major arch vessel origins. Right carotid system: No evidence of dissection, stenosis (50% or greater), or occlusion. Left carotid system: No evidence of dissection, stenosis (50% or greater), or occlusion. Vertebral arteries: No evidence of dissection, stenosis (50% or greater), or occlusion. Skeleton: No discrete or worrisome osseous lesions. Other neck: No other acute finding. Upper chest: No other acute finding. Review of the MIP images confirms the above findings CTA HEAD FINDINGS Anterior circulation: Both internal carotid arteries are patent to the termini without stenosis or other abnormality. A1 segments, anterior communicating artery complex common anterior cerebral arteries patent without stenosis. No M1 stenosis or occlusion. Distal MCA branches perfused and symmetric. Posterior circulation: Both V4 segments patent without stenosis. Both PICA patent. Basilar patent without stenosis. Superior cerebral arteries patent bilaterally. Both PCAs patent and well perfused to their distal aspects. Venous sinuses: Patent allowing for timing the contrast bolus. Anatomic variants: None significant.  No aneurysm. Review of the MIP images confirms the above findings IMPRESSION: 1. Normal head CT. No acute intracranial abnormality. 2. Aspects equals 10. 3. Normal CTA of the head and neck. No large vessel occlusion, hemodynamically significant stenosis, or other acute vascular abnormality. Results were called by telephone at the time of interpretation on 06/28/2023 at 11:46 pm to provider ROBERT PATERSON. Electronically Signed   By: Rise Mu M.D.   On: 06/28/2023 23:53    Procedures Procedures    Medications Ordered in ED Medications  diazepam (VALIUM) injection 5 mg (5 mg Intravenous Given  06/29/23 1915)    ED Course/ Medical Decision Making/ A&P                    NIH Stroke Scale: 1              Medical Decision Making Todd Lyons is a 22 y.o. male here presenting with left-sided numbness.  The numbness had resolved.  Plan to get MRI brain and cervical spine with and without contrast to rule out multiple sclerosis versus stroke.   8:57 PM I reviewed patient's labs independently interpreted MRI.  MRI is normal.  At this point patient stable for discharge.  I suspect that he may have some anxiety versus complex migraine.  Problems Addressed: Numbness: acute illness or injury  Amount and/or Complexity of Data Reviewed Labs: ordered. Decision-making details documented in ED Course. Radiology: ordered and independent interpretation performed. Decision-making details documented in ED Course.  Risk Prescription drug management.    Final Clinical Impression(s) / ED Diagnoses Final diagnoses:  None    Rx / DC Orders ED Discharge Orders     None         Charlynne Pander, MD 06/29/23 2057

## 2023-06-29 NOTE — ED Provider Notes (Signed)
Patient signed out to me by Dr. Eloise Harman.  Patient seen with altered sensation on the left side of his body.  There is no motor function deficit.  Patient underwent neurology evaluation.  Recommendation by teleneurology is to obtain MRI of brain with and without contrast to evaluate for stroke and demyelinating disease.  I have gone over this extensively with the patient and his family.  He does not feel that he could perform the MRI because of claustrophobia.  I did tell him that he would be premedicated with antianxiety medication but he still feels like he might need anesthesia.  I did inform him that that is not an option.  Patient has discussed plans with his family and I have discussed multiple times options with the patient.  This included transfer to [facility] for imaging immediately, delaying imaging until this morning at [facility] as well as following up with his neurologist for outpatient workup.  Patient does not want to go to [facility].  He would like to go home and come down, think about it, likely return tomorrow.  I informed him that he if he comes first thing in the morning the MRI can be done at [facility] but if it is late morning or afternoon, it cannot be done here.  He would need to go to [facility] or [place] to complete the MRI.   Gilda Crease, MD 06/29/23 0030

## 2023-06-29 NOTE — ED Notes (Signed)
Pt educated on needing to return tomorrow for his MRI. Pt verbally communicated understanding.

## 2023-09-01 ENCOUNTER — Emergency Department (HOSPITAL_COMMUNITY)
Admission: EM | Admit: 2023-09-01 | Discharge: 2023-09-01 | Disposition: A | Discharge status: Home / Self Care | Payer: BC Managed Care – PPO

## 2023-09-01 NOTE — ED Notes (Signed)
Bleeding was controlled when Pt was called back for triage.  Pt decided he did not want to be seen.  Band-Aid provided and Pt encouraged to return if he decided he wants to be seen.

## 2023-11-02 ENCOUNTER — Emergency Department (HOSPITAL_COMMUNITY)
Admission: EM | Admit: 2023-11-02 | Discharge: 2023-11-02 | Disposition: A | Discharge status: Home / Self Care | Payer: BC Managed Care – PPO | Attending: Emergency Medicine | Admitting: Emergency Medicine

## 2023-11-02 ENCOUNTER — Other Ambulatory Visit: Payer: Self-pay

## 2023-11-02 DIAGNOSIS — R112 Nausea with vomiting, unspecified: Secondary | ICD-10-CM | POA: Insufficient documentation

## 2023-11-02 DIAGNOSIS — B338 Other specified viral diseases: Secondary | ICD-10-CM

## 2023-11-02 DIAGNOSIS — B974 Respiratory syncytial virus as the cause of diseases classified elsewhere: Secondary | ICD-10-CM | POA: Diagnosis not present

## 2023-11-02 DIAGNOSIS — R059 Cough, unspecified: Secondary | ICD-10-CM | POA: Diagnosis not present

## 2023-11-02 DIAGNOSIS — J029 Acute pharyngitis, unspecified: Secondary | ICD-10-CM | POA: Insufficient documentation

## 2023-11-02 DIAGNOSIS — M7918 Myalgia, other site: Secondary | ICD-10-CM | POA: Insufficient documentation

## 2023-11-02 LAB — URINALYSIS, ROUTINE W REFLEX MICROSCOPIC
Bacteria, UA: NONE SEEN
Bilirubin Urine: NEGATIVE
Glucose, UA: NEGATIVE mg/dL
Ketones, ur: NEGATIVE mg/dL
Leukocytes,Ua: NEGATIVE
Nitrite: NEGATIVE
Protein, ur: NEGATIVE mg/dL
Specific Gravity, Urine: 1.019 (ref 1.005–1.030)
pH: 6 (ref 5.0–8.0)

## 2023-11-02 LAB — CBC
HCT: 47.2 % (ref 39.0–52.0)
Hemoglobin: 16.7 g/dL (ref 13.0–17.0)
MCH: 30.7 pg (ref 26.0–34.0)
MCHC: 35.4 g/dL (ref 30.0–36.0)
MCV: 86.8 fL (ref 80.0–100.0)
Platelets: 397 10*3/uL (ref 150–400)
RBC: 5.44 MIL/uL (ref 4.22–5.81)
RDW: 12.8 % (ref 11.5–15.5)
WBC: 6.2 10*3/uL (ref 4.0–10.5)
nRBC: 0 % (ref 0.0–0.2)

## 2023-11-02 LAB — COMPREHENSIVE METABOLIC PANEL
ALT: 48 U/L — ABNORMAL HIGH (ref 0–44)
AST: 21 U/L (ref 15–41)
Albumin: 5 g/dL (ref 3.5–5.0)
Alkaline Phosphatase: 67 U/L (ref 38–126)
Anion gap: 10 (ref 5–15)
BUN: 16 mg/dL (ref 6–20)
CO2: 24 mmol/L (ref 22–32)
Calcium: 10 mg/dL (ref 8.9–10.3)
Chloride: 104 mmol/L (ref 98–111)
Creatinine, Ser: 0.95 mg/dL (ref 0.61–1.24)
GFR, Estimated: >60 mL/min (ref >60)
Glucose, Bld: 97 mg/dL (ref 70–99)
Potassium: 4.3 mmol/L (ref 3.5–5.1)
Sodium: 138 mmol/L (ref 135–145)
Total Bilirubin: 1.6 mg/dL — ABNORMAL HIGH (ref 0.0–1.2)
Total Protein: 7.7 g/dL (ref 6.5–8.1)

## 2023-11-02 LAB — RESP PANEL BY RT-PCR (RSV, FLU A&B, COVID)  RVPGX2
Influenza A by PCR: NEGATIVE
Influenza B by PCR: NEGATIVE
Resp Syncytial Virus by PCR: POSITIVE — AB
SARS Coronavirus 2 by RT PCR: NEGATIVE

## 2023-11-02 LAB — LIPASE, BLOOD: Lipase: 24 U/L (ref 11–51)

## 2023-11-02 MED ORDER — ONDANSETRON 4 MG PO TBDP
4.0000 mg | ORAL_TABLET | Freq: Once | ORAL | Status: AC
  Administered 2023-11-02: 4 mg via ORAL
  Filled 2023-11-02: qty 1

## 2023-11-02 MED ORDER — ONDANSETRON HCL 4 MG PO TABS: 4.0000 mg | ORAL_TABLET | Freq: Four times a day (QID) | ORAL | 0 refills | Status: DC

## 2023-11-02 MED ORDER — HYDROXYZINE HCL 25 MG PO TABS: 25.0000 mg | ORAL_TABLET | Freq: Four times a day (QID) | ORAL | 0 refills | Status: DC

## 2023-11-02 MED ORDER — HYDROXYZINE HCL 25 MG PO TABS
50.0000 mg | ORAL_TABLET | Freq: Once | ORAL | Status: AC
  Administered 2023-11-02: 50 mg via ORAL
  Filled 2023-11-02: qty 2

## 2023-11-02 NOTE — ED Triage Notes (Addendum)
 Pt with emesis since last night, + body aches + chills. Denies any diarrhea.  Denies any recent sick contacts.
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
## 2023-11-02 NOTE — Discharge Instructions (Addendum)
 It was a pleasure taking part in your care.  As we discussed, you have RSV.  This is a self-limiting virus that will resolve with time.  Please treat your symptoms conservatively at home.  Take Tylenol for headaches and fevers.  Take ibuprofen for bodyaches and chills.  You may purchase Zicam to help shorten duration of your symptoms.  In terms of your nausea, please take Zofran every 6 hours as needed.  Please also take hydroxyzine at night to help sleep, this is for anxiety.  Follow-up with your PCP.  Return to the ED with any new or worsening symptoms.

## 2023-11-25 ENCOUNTER — Other Ambulatory Visit: Payer: Self-pay

## 2023-11-25 ENCOUNTER — Encounter (HOSPITAL_COMMUNITY): Payer: Self-pay

## 2023-11-25 ENCOUNTER — Emergency Department (HOSPITAL_COMMUNITY)
Admission: EM | Admit: 2023-11-25 | Discharge: 2023-11-25 | Discharge status: Against Medical Advice | Attending: Emergency Medicine | Admitting: Emergency Medicine

## 2023-11-25 DIAGNOSIS — F419 Anxiety disorder, unspecified: Secondary | ICD-10-CM | POA: Diagnosis present

## 2023-11-25 DIAGNOSIS — Z5321 Procedure and treatment not carried out due to patient leaving prior to being seen by health care provider: Secondary | ICD-10-CM | POA: Insufficient documentation

## 2023-11-25 DIAGNOSIS — R42 Dizziness and giddiness: Secondary | ICD-10-CM | POA: Insufficient documentation

## 2023-11-25 DIAGNOSIS — R079 Chest pain, unspecified: Secondary | ICD-10-CM | POA: Diagnosis not present

## 2023-11-25 HISTORY — DX: Anxiety disorder, unspecified: F41.9

## 2023-11-25 NOTE — ED Triage Notes (Signed)
 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

## 2024-05-07 ENCOUNTER — Encounter: Payer: Self-pay | Admitting: Radiology

## 2024-05-13 ENCOUNTER — Ambulatory Visit (INDEPENDENT_AMBULATORY_CARE_PROVIDER_SITE_OTHER): Admitting: Podiatry

## 2024-05-13 ENCOUNTER — Encounter: Payer: Self-pay | Admitting: Podiatry

## 2024-05-13 DIAGNOSIS — L600 Ingrowing nail: Secondary | ICD-10-CM | POA: Diagnosis not present

## 2024-05-13 NOTE — Patient Instructions (Signed)

## 2024-05-14 NOTE — Progress Notes (Signed)
 Subjective:   Patient ID: Todd Lyons, male   DOB: 23 y.o.   MRN: [id number]   HPI Patient presents with caregiver with a very painful ingrown toenail deformity left big toe that is been present for a long time and has had some drainage not as bad but still present.  Patient does not smoke likes to be active   Review of Systems  All other systems reviewed and are negative.       Objective:  Physical Exam Vitals and nursing note reviewed.  Constitutional:      Appearance: He is well-developed.  Pulmonary:     Effort: Pulmonary effort is normal.  Musculoskeletal:        General: Normal range of motion.  Skin:    General: Skin is warm.  Neurological:     Mental Status: He is alert.     Neurovascular status intact muscle strength found to be adequate range of motion adequate with patient noted to have pain in the lateral border of the left hallux that is incurvated and sore and make shoe gear difficult.  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  At this point I went ahead and infiltrated the left big toe 60 mg like a Marcaine  mixture sterile prep done and using sterile instrumentation remove the lateral border exposed matrix applied phenol 3 applications followed by lavage therapy with alcohol and sterile dressing.  Gave instructions on soaks and to wear dressing 24 hours taken off earlier if throbbing were to occur and call with any questions I did go ahead today and I wrote for doxycycline to be careful because of the redness in the digit.  Strongly encouraged to let us  know if any issues were to occur

## 2024-06-09 ENCOUNTER — Ambulatory Visit

## 2024-06-09 ENCOUNTER — Ambulatory Visit: Attending: Cardiology | Admitting: Cardiology

## 2024-06-09 ENCOUNTER — Encounter: Payer: Self-pay | Admitting: Cardiology

## 2024-06-09 VITALS — BP 134/72 | HR 72 | Ht 70.0 in | Wt 192.6 lb

## 2024-06-09 DIAGNOSIS — R000 Tachycardia, unspecified: Secondary | ICD-10-CM

## 2024-06-09 DIAGNOSIS — R42 Dizziness and giddiness: Secondary | ICD-10-CM | POA: Diagnosis not present

## 2024-06-09 DIAGNOSIS — R002 Palpitations: Secondary | ICD-10-CM

## 2024-06-09 NOTE — Progress Notes (Signed)
 Clinical Summary Todd Lyons is a 23 y.o.male seen today as a new consult, referred by NP [facility] for the following medical problems.   1.Tachycardia/dizziness - can have feeling of irregular heart beats, typically happens when feeling anxious - EKG today shows SR with sinus arrhythmia - symptoms occurs about 1-2 times per week - can occur after eating  - no caffeine, no EtoH.    2.Anxiety/panic attacks/PTSD - followed by pcp   Works at [employer], walks long distance  Past Medical History:  Diagnosis Date   ADHD (attention deficit hyperactivity disorder)    Anxiety    Environmental allergies    Seizures (HCC)      Allergies  Allergen Reactions   Morphine  Rash     Current Outpatient Medications  Medication Sig Dispense Refill   albuterol (PROVENTIL HFA;VENTOLIN HFA) 108 (90 Base) MCG/ACT inhaler Inhale 1-2 puffs into the lungs every 6 (six) hours as needed for wheezing or shortness of breath.      LORazepam  (ATIVAN ) 0.5 MG tablet Take 0.5 mg by mouth every 8 (eight) hours as needed for anxiety.     cetirizine  (ZYRTEC ) 10 MG tablet Take 10 mg by mouth daily. (Patient not taking: Reported on 06/09/2024)     hydrOXYzine  (ATARAX ) 25 MG tablet Take 1 tablet (25 mg total) by mouth every 6 (six) hours. (Patient not taking: Reported on 06/09/2024) 20 tablet 0   LamoTRIgine 200 MG TB24 24 hour tablet Take 2 tablets by mouth at bedtime. (Patient not taking: Reported on 06/09/2024)     montelukast  (SINGULAIR ) 10 MG tablet Take 10 mg by mouth daily. (Patient not taking: Reported on 06/09/2024)     naproxen  (NAPROSYN ) 500 MG tablet Take 1 tablet (500 mg total) by mouth 2 (two) times daily. (Patient not taking: Reported on 06/09/2024) 20 tablet 0   NAYZILAM  5 MG/0.1ML SOLN      ondansetron  (ZOFRAN ) 4 MG tablet Take 1 tablet (4 mg total) by mouth every 6 (six) hours. (Patient not taking: Reported on 06/09/2024) 12 tablet 0   oxyCODONE -acetaminophen  (PERCOCET/ROXICET) 5-325 MG tablet  Take 1 tablet by mouth every 6 (six) hours as needed for severe pain. (Patient not taking: Reported on 06/09/2024) 30 tablet 0   phenylephrine -shark liver oil-mineral oil-petrolatum (PREPARATION H) 0.25-14-74.9 % rectal ointment Place 1 Application rectally 2 (two) times daily as needed for hemorrhoids. (Patient not taking: Reported on 06/09/2024) 56 g 2   Psyllium (GOODSENSE PSYLLIUM FIBER) 51.7 % POWD Take 1 capsule by mouth every 12 (twelve) hours. (Patient not taking: Reported on 06/09/2024) 660 g 1   sertraline (ZOLOFT) 25 MG tablet Take 25 mg by mouth daily. (Patient not taking: Reported on 06/09/2024)     tamsulosin  (FLOMAX ) 0.4 MG CAPS capsule Take 1 capsule (0.4 mg total) by mouth daily after supper. (Patient not taking: Reported on 06/09/2024) 30 capsule 0   traZODone (DESYREL) 50 MG tablet Take 50 mg by mouth at bedtime. (Patient not taking: Reported on 06/09/2024)     No current facility-administered medications for this visit.     Past Surgical History:  Procedure Laterality Date   APPENDECTOMY     LAPAROSCOPIC APPENDECTOMY N/A 08/29/2021   Procedure: APPENDECTOMY LAPAROSCOPIC;  Surgeon: Kallie Manuelita BROCKS, MD;  Location: [facility];  Service: General;  Laterality: N/A;   TYMPANOSTOMY TUBE PLACEMENT       Allergies  Allergen Reactions   Morphine  Rash      Family History  Problem Relation Age of Onset  Asthma Father    Asthma Paternal Grandmother    Allergic rhinitis Neg Hx    Angioedema Neg Hx    Atopy Neg Hx    Eczema Neg Hx    Immunodeficiency Neg Hx    Urticaria Neg Hx      Social History Todd Lyons reports that he has never smoked. He has never used smokeless tobacco. Todd Lyons reports no history of alcohol use.    Physical Examination Today's Vitals   06/09/24 1507 06/09/24 1536  BP: (!) 142/90 134/72  Pulse: 72   SpO2: 98%   Weight: 192 lb 9.6 oz (87.4 kg)   Height: 5' 10 (1.778 m)    Body mass index is 27.64 kg/m.  Gen: resting comfortably, no  acute distress HEENT: no scleral icterus, pupils equal round and reactive, no palptable cervical adenopathy,  CV: RRR, no m/rg, no jvd Resp: Clear to auscultation bilaterally GI: abdomen is soft, non-tender, non-distended, normal bowel sounds, no hepatosplenomegaly MSK: extremities are warm, no edema.  Skin: warm, no rash Neuro:  no focal deficits Psych: appropriate affect    Assessment and Plan  1.Palpitations - possibly related to is anxiety, cannot exclude potential cardiac arrhythmia or ectopy - plan for 7 day zio patch to further evaluate - if benign monitor would f/u just as needed - EKG today show SR, sinus arrhythmia      Dorn PHEBE Ross, M.D.

## 2024-06-09 NOTE — Patient Instructions (Signed)
 Medication Instructions:  Your physician recommends that you continue on your current medications as directed. Please refer to the Current Medication list given to you today.  *If you need a refill on your cardiac medications before your next appointment, please call your pharmacy*  Lab Work: None If you have labs (blood work) drawn today and your tests are completely normal, you will receive your results only by: MyChart Message (if you have MyChart) OR A paper copy in the mail If you have any lab test that is abnormal or we need to change your treatment, we will call you to review the results.  Testing/Procedures: 7 day zio  Follow-Up: At [facility], you and your health needs are our priority.  As part of our continuing mission to provide you with exceptional heart care, our providers are all part of one team.  This team includes your primary Cardiologist (physician) and Advanced Practice Providers or APPs (Physician Assistants and Nurse Practitioners) who all work together to provide you with the care you need, when you need it.  Your next appointment:    TO BE DETERMINED   Provider:   You may see Dorn Ross, MD or one of the following Advanced Practice Providers on your designated Care Team:   Laymon Qua, PA-C  [place], [place] Olivia Pavy, [place]     We recommend signing up for the patient portal called MyChart.  Sign up information is provided on this After Visit Summary.  MyChart is used to connect with patients for Virtual Visits (Telemedicine).  Patients are able to view lab/test results, encounter notes, upcoming appointments, etc.  Non-urgent messages can be sent to your provider as well.   To learn more about what you can do with MyChart, go to [email or web address].   Other Instructions ZIO XT- Long Term Monitor Instructions   Your physician has requested you wear your ZIO patch monitor___7____days.   This is a single patch monitor.   [employer] supplies one patch monitor per enrollment.  Additional stickers are not available.   Please do not apply patch if you will be having a Nuclear Stress Test, Echocardiogram, Cardiac CT, MRI, or Chest Xray during the time frame you would be wearing the monitor. The patch cannot be worn during these tests.  You cannot remove and re-apply the ZIO XT patch monitor.   Your ZIO patch monitor will be sent USPS Priority mail from [facility] directly to your home address. The monitor may also be mailed to a PO BOX if home delivery is not available.   It may take 3-5 days to receive your monitor after you have been enrolled.   Once you have received you monitor, please review enclosed instructions.  Your monitor has already been registered assigning a specific monitor serial # to you.   Applying the monitor   Shave hair from upper left chest.   Hold abrader disc by orange tab.  Rub abrader in 40 strokes over left upper chest as indicated in your monitor instructions.   Clean area with 4 enclosed alcohol pads .  Use all pads to assure are is cleaned thoroughly.  Let dry.   Apply patch as indicated in monitor instructions.  Patch will be place under collarbone on left side of chest with arrow pointing upward.   Rub patch adhesive wings for 2 minutes.Remove white label marked 1.  Remove white label marked 2.  Rub patch adhesive wings for 2 additional minutes.   While looking in a mirror,  press and release button in center of patch.  A small green light will flash 3-4 times .  This will be your only indicator the monitor has been turned on.     Do not shower for the first 24 hours.  You may shower after the first 24 hours.   Press button if you feel a symptom. You will hear a small click.  Record Date, Time and Symptom in the Patient Log Book.   When you are ready to remove patch, follow instructions on last 2 pages of Patient Log Book.  Stick patch monitor onto last page of Patient  Log Book.   Place Patient Log Book in [place] box.  Use locking tab on box and tape box closed securely.  The [employer] has [employer] on it.  Please place in mailbox as soon as possible.  Your physician should have your test results approximately 7 days after the monitor has been mailed back to [facility].   Call [facility] Customer Care at [phone] if you have questions regarding your ZIO XT patch monitor.  Call them immediately if you see an orange light blinking on your monitor.   If your monitor falls off in less than 4 days contact our Monitor department at [phone].  If your monitor becomes loose or falls off after 4 days call [employer] at [phone] for suggestions on securing your monitor.

## 2024-07-12 ENCOUNTER — Ambulatory Visit (HOSPITAL_COMMUNITY): Admitting: Registered Nurse

## 2024-07-19 ENCOUNTER — Encounter: Payer: Self-pay | Admitting: Radiology

## 2024-08-16 DIAGNOSIS — R002 Palpitations: Secondary | ICD-10-CM | POA: Diagnosis not present

## 2024-09-01 ENCOUNTER — Ambulatory Visit: Payer: Self-pay | Admitting: Cardiology

## 2024-09-11 ENCOUNTER — Ambulatory Visit
Admission: EM | Admit: 2024-09-11 | Discharge: 2024-09-11 | Disposition: A | Discharge status: Home / Self Care | Attending: Family Medicine | Admitting: Family Medicine

## 2024-09-11 DIAGNOSIS — J069 Acute upper respiratory infection, unspecified: Secondary | ICD-10-CM

## 2024-09-11 DIAGNOSIS — Z20828 Contact with and (suspected) exposure to other viral communicable diseases: Secondary | ICD-10-CM

## 2024-09-11 LAB — POCT INFLUENZA A/B
Influenza A, POC: NEGATIVE
Influenza B, POC: NEGATIVE

## 2024-09-11 MED ORDER — AZELASTINE HCL 0.1 % NA SOLN: 1.0000 | Freq: Two times a day (BID) | NASAL | 0 refills | Status: AC

## 2024-09-11 MED ORDER — PROMETHAZINE-DM 6.25-15 MG/5ML PO SYRP: 5.0000 mL | ORAL_SOLUTION | Freq: Four times a day (QID) | ORAL | 0 refills | Status: AC | PRN

## 2024-09-11 MED ORDER — OSELTAMIVIR PHOSPHATE 75 MG PO CAPS: 75.0000 mg | ORAL_CAPSULE | Freq: Two times a day (BID) | ORAL | 0 refills | Status: AC

## 2024-09-11 NOTE — ED Triage Notes (Addendum)
 Pt reports cough, congestion, fever, body aches x 2 days pt states he has had exposure to flu B.

## 2024-09-11 NOTE — ED Provider Notes (Signed)
 " [facility]    CSN: [id number] Arrival date & time: 09/11/24  0807      History   Chief Complaint No chief complaint on file.   HPI Todd Lyons is a 23 y.o. male.   Patient presenting today with 2-day history of cough, congestion, fever, body aches, fatigue.  Denies chest pain, shortness of breath, vomiting, diarrhea, rashes.  So far trying over-the-counter cold and congestion medication with minimal relief.  Recent close exposure to influenza B.    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 Authorizing Provider  azelastine  (ASTELIN ) 0.1 % nasal spray Place 1 spray into both nostrils 2 (two) times daily. Use in each nostril as directed 09/11/24  Yes Stuart Vernell Norris, PA-C  oseltamivir  (TAMIFLU ) 75 MG capsule Take 1 capsule (75 mg total) by mouth every 12 (twelve) hours. 09/11/24  Yes Stuart Vernell Norris, PA-C  promethazine -dextromethorphan (PROMETHAZINE -DM) 6.25-15 MG/5ML syrup Take 5 mLs by mouth 4 (four) times daily as needed. 09/11/24  Yes Stuart Vernell Norris,  PA-C  albuterol (PROVENTIL HFA;VENTOLIN HFA) 108 (90 Base) MCG/ACT inhaler Inhale 1-2 puffs into the lungs every 6 (six) hours as needed for wheezing or shortness of breath.  04/29/16   [provider]  cetirizine  (ZYRTEC ) 10 MG tablet Take 10 mg by mouth daily. Patient not taking: Reported on 06/09/2024    [provider]  LORazepam  (ATIVAN ) 0.5 MG tablet Take 0.5 mg by mouth every 8 (eight) hours as needed for anxiety.    [provider]  montelukast  (SINGULAIR ) 10 MG tablet Take 10 mg by mouth daily. Patient not taking: Reported on 06/09/2024 09/16/19   [provider]  sertraline (ZOLOFT) 100 MG tablet Take 100 mg by mouth daily.    [provider]  levETIRAcetam  500 MG TB3D Take 1/2 tablet twice daily for 7 days, 1 tablet twice daily for 7 days, then 1-1/2 tablets twice daily 10/01/19 08/03/20  Susen Elsie DEL, MD    Family History Family History  Problem Relation Age of Onset   Asthma Father    Asthma Paternal Grandmother    Allergic rhinitis Neg Hx    Angioedema Neg Hx    Atopy Neg Hx    Eczema Neg Hx    Immunodeficiency Neg Hx    Urticaria Neg Hx     Social History Social History[1]   Allergies   Morphine    Review of Systems Review of Systems PER HPI  Physical Exam Triage Vital Signs ED Triage Vitals  Encounter Vitals Group     BP 09/11/24  9060 124/79     Girls Systolic BP Percentile --      Girls Diastolic BP Percentile --      Boys Systolic BP Percentile --      Boys Diastolic BP Percentile --      Pulse Rate 09/11/24 0939 (!) 108     Resp 09/11/24 0939 20     Temp 09/11/24 0939 99.6 F (37.6 C)     Temp src --      SpO2 09/11/24 0939 97 %     Weight --      Height --      Head Circumference --      Peak Flow --      Pain Score 09/11/24 0942 2     Pain Loc --      Pain Education --      Exclude from Growth Chart --    No data found.  Updated Vital Signs BP 124/79 (BP Location: Right Arm)   Pulse (!)  108   Temp 99.6 F (37.6 C)   Resp 20   SpO2 97%   Visual Acuity Right Eye Distance:   Left Eye Distance:   Bilateral Distance:    Right Eye Near:   Left Eye Near:    Bilateral Near:     Physical Exam Vitals and nursing note reviewed.  Constitutional:      Appearance: He is well-developed.  HENT:     Head: Atraumatic.     Right Ear: Tympanic membrane and external ear normal.     Left Ear: Tympanic membrane and external ear normal.     Nose: Rhinorrhea present.     Mouth/Throat:     Pharynx: Posterior oropharyngeal erythema present. No oropharyngeal exudate.  Eyes:     Conjunctiva/sclera: Conjunctivae normal.     Pupils: Pupils are equal, round, and reactive to light.  Cardiovascular:     Rate and Rhythm: Normal rate and regular rhythm.  Pulmonary:     Effort: Pulmonary effort is normal. No respiratory distress.     Breath sounds: No wheezing or rales.  Musculoskeletal:        General: Normal range of motion.     Cervical back: Normal range of motion and neck supple.  Lymphadenopathy:     Cervical: No cervical adenopathy.  Skin:    General: Skin is warm and dry.  Neurological:     Mental Status: He is alert and oriented to person, place, and time.  Psychiatric:        Behavior: Behavior normal.    UC Treatments / Results  Labs (all labs ordered are listed, but only abnormal results are displayed) Labs Reviewed  POCT INFLUENZA A/B   EKG   Radiology No results found.  Procedures Procedures (including critical care time)  Medications Ordered in UC Medications - No data to display  Initial Impression / Assessment and Plan / UC Course  I have reviewed the triage vital signs and the nursing notes.  Pertinent labs & imaging results that were available during my care of the patient were reviewed by me and considered in my medical decision making (see chart for details).     Vitals and exam overall reassuring today, he is well-appearing and in no acute  distress.  Rapid flu negative today but given close home exposure to suspect influenza-like illness.  Treat with Tamiflu , Astelin , Phenergan  DM, supportive over-the-counter medications and home care.  Return for worsening or unresolving symptoms.  Final Clinical Impressions(s) /  UC Diagnoses   Final diagnoses:  Viral URI with cough  Exposure to influenza   Discharge Instructions   None    ED Prescriptions     Medication Sig Dispense Auth. Provider   oseltamivir  (TAMIFLU ) 75 MG capsule Take 1 capsule (75 mg total) by mouth every 12 (twelve) hours. 10 capsule Stuart Vernell Norris, PA-C   azelastine  (ASTELIN ) 0.1 % nasal spray Place 1 spray into both nostrils 2 (two) times daily. Use in each nostril as directed 30 mL Stuart Vernell Norris, PA-C   promethazine -dextromethorphan (PROMETHAZINE -DM) 6.25-15 MG/5ML syrup Take 5 mLs by mouth 4 (four) times daily as needed. 100 mL Stuart Vernell Norris, [place]      PDMP not reviewed this encounter.    [1]  Social History Tobacco Use   Smoking status: Never   Smokeless tobacco: Never  Vaping Use   Vaping status: Every Day   Substances: Nicotine  Substance Use Topics   Alcohol use: No   Drug use: No     Stuart Vernell Norris, PA-C 09/11/24 1009  "

## 2024-10-08 ENCOUNTER — Ambulatory Visit
Admission: EM | Admit: 2024-10-08 | Discharge: 2024-10-08 | Disposition: A | Discharge status: Home / Self Care | Attending: Physician Assistant | Admitting: Physician Assistant

## 2024-10-08 DIAGNOSIS — J0121 Acute recurrent ethmoidal sinusitis: Secondary | ICD-10-CM | POA: Diagnosis not present

## 2024-10-08 LAB — POC COVID19/FLU A&B COMBO
Covid Antigen, POC: NEGATIVE
Influenza A Antigen, POC: NEGATIVE
Influenza B Antigen, POC: NEGATIVE

## 2024-10-08 MED ORDER — CEFDINIR 300 MG PO CAPS: 300.0000 mg | ORAL_CAPSULE | Freq: Two times a day (BID) | ORAL | 0 refills | Status: AC

## 2024-10-08 NOTE — ED Provider Notes (Signed)
 " [facility]    CSN: [id number] Arrival date & time: 10/08/24  1644      History   Chief Complaint No chief complaint on file.   HPI Todd Lyons is a 24 y.o. male.   Patient reports he has developed sinus infections after having a wisdom tooth extracted that went into his sinuses.  Patient states he has been previously treated with antibiotics 3 times for sinus infection.  Patient complains of pain in his left sinus area.  Patient reports that he has had a bad tasting drainage from the sinus he has had a slight cough.  The history is provided by the patient. No language interpreter was used.    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 Authorizing Provider  cefdinir (OMNICEF) 300 MG capsule Take 1 capsule (300 mg total) by mouth 2 (two) times daily. 10/08/24  Yes Kalla Watson K, PA-C  albuterol (PROVENTIL HFA;VENTOLIN HFA) 108 (90 Base) MCG/ACT inhaler Inhale 1-2 puffs into the lungs every 6 (six) hours as needed for wheezing or shortness of breath.  04/29/16   [provider]  azelastine  (ASTELIN ) 0.1 % nasal spray Place 1 spray into both nostrils 2 (two) times daily. Use in each nostril as directed 09/11/24   Stuart Vernell Norris, PA-C  cetirizine  (ZYRTEC ) 10 MG tablet Take 10 mg by mouth daily. Patient not taking: Reported on 06/09/2024    [provider]  LORazepam  (ATIVAN ) 0.5 MG tablet Take 0.5 mg by mouth every 8 (eight) hours as needed for anxiety.    [provider]  montelukast  (SINGULAIR ) 10 MG tablet Take 10 mg by mouth daily. Patient not taking: Reported on 06/09/2024 09/16/19   [provider]  oseltamivir  (TAMIFLU ) 75 MG capsule Take 1 capsule (75 mg total) by mouth every 12 (twelve) hours. 09/11/24   Stuart Vernell Norris, PA-C  promethazine -dextromethorphan (PROMETHAZINE -DM) 6.25-15 MG/5ML syrup Take 5 mLs by mouth 4 (four) times daily as needed. 09/11/24   Stuart Vernell Norris, PA-C  sertraline (ZOLOFT) 100 MG tablet Take 100 mg by mouth daily.    [provider]  levETIRAcetam  500 MG TB3D Take 1/2 tablet twice daily for 7 days, 1 tablet twice daily for 7 days, then 1-1/2 tablets twice daily 10/01/19 08/03/20  Susen Elsie DEL, MD    Family History Family History  Problem Relation Age of Onset   Asthma Father    Asthma Paternal Grandmother    Allergic rhinitis Neg Hx    Angioedema Neg Hx    Atopy Neg Hx  Eczema Neg Hx    Immunodeficiency Neg Hx    Urticaria Neg Hx     Social History Social History[1]   Allergies   Morphine    Review of Systems Review of Systems  All other systems reviewed and are negative.    Physical Exam Triage Vital Signs ED Triage Vitals  Encounter Vitals Group     BP 10/08/24 1648 125/89     Girls Systolic BP Percentile --      Girls Diastolic BP Percentile --      Boys Systolic BP Percentile --      Boys Diastolic BP Percentile --      Pulse Rate 10/08/24 1648 (!) 105     Resp 10/08/24 1648 16     Temp 10/08/24 1648 98.5 F (36.9 C)      Temp Source 10/08/24 1648 Oral     SpO2 10/08/24 1648 93 %     Weight --      Height --      Head Circumference --      Peak Flow --      Pain Score 10/08/24 1651 0     Pain Loc --      Pain Education --      Exclude from Growth Chart --    No data found.  Updated Vital Signs BP 125/89 (BP Location: Right Arm)   Pulse (!) 105   Temp 98.5 F (36.9 C) (Oral)   Resp 16   SpO2 93%   Visual Acuity Right Eye Distance:   Left Eye Distance:   Bilateral Distance:    Right Eye Near:   Left Eye Near:    Bilateral Near:     Physical Exam Vitals and nursing note reviewed.  Constitutional:      Appearance: He is well-developed.  HENT:     Head: Normocephalic.     Comments: Tender left maxillary sinus    Right Ear: Tympanic membrane normal.     Left Ear: Tympanic membrane normal.     Nose: Nose normal.     Mouth/Throat:     Mouth: Mucous membranes are moist.  Cardiovascular:     Rate and Rhythm: Normal rate.  Pulmonary:     Effort: Pulmonary effort is normal.  Abdominal:     General: There is no distension.  Musculoskeletal:        General: Normal range of motion.     Cervical back: Normal range of motion.  Skin:    General: Skin is warm.  Neurological:     General: No focal deficit present.     Mental Status: He is alert and oriented to person, place, and time.      UC Treatments / Results  Labs (all labs ordered are listed, but only abnormal results are displayed) Labs Reviewed  POC COVID19/FLU A&B COMBO - Normal    EKG   Radiology No results found.  Procedures Procedures (including critical care time)  Medications Ordered in UC Medications - No data to display  Initial Impression / Assessment and Plan / UC Course  I have reviewed the triage vital signs and the nursing notes.  Pertinent labs & imaging results that were available during my care of the patient were reviewed by me and considered in my medical decision making (see chart for  details).     An After Visit Summary was printed and given to the patient.     Final Clinical Impressions(s) / UC Diagnoses   Final diagnoses:  Acute recurrent ethmoidal sinusitis     Discharge Instructions      Return if any problems.  Follow-up with your primary care physician for recheck   ED Prescriptions     Medication Sig Dispense Auth. Provider   cefdinir (OMNICEF) 300 MG capsule Take 1 capsule (300 mg total) by mouth 2 (two) times daily. 20 capsule Albeiro Trompeter K, PA-C      PDMP not reviewed this encounter.    [1]  Social History Tobacco Use   Smoking status: Never   Smokeless tobacco: Never  Vaping Use   Vaping status: Every Day   Substances: Nicotine  Substance Use Topics   Alcohol use: No   Drug use: No     Flint Sonny POUR, PA-C 10/08/24 1725  "

## 2024-10-08 NOTE — Discharge Instructions (Addendum)
 Return if any problems.  Follow-up with your primary care physician for recheck

## 2024-10-08 NOTE — ED Triage Notes (Signed)
 Pt reports he has had a  fever, lethargic, smells like it is infection, on his left side nasal congestion,and body aches x 4 days
